# Patient Record
Sex: Male | Born: 1965
Health system: Southern US, Community
[De-identification: ages and names within clinical notes are randomized; demographics above are authoritative.]

## PROBLEM LIST (undated history)

## (undated) DIAGNOSIS — N400 Enlarged prostate without lower urinary tract symptoms: Secondary | ICD-10-CM

## (undated) HISTORY — DX: Benign prostatic hyperplasia without lower urinary tract symptoms: N40.0

## (undated) HISTORY — PX: PROSTATE BIOPSY: SHX241

---

## 2009-02-01 ENCOUNTER — Encounter: Admission: RE | Admit: 2009-02-01 | Discharge: 2009-02-01 | Payer: Self-pay | Admitting: Internal Medicine

## 2015-12-30 DIAGNOSIS — H40009 Preglaucoma, unspecified, unspecified eye: Secondary | ICD-10-CM | POA: Insufficient documentation

## 2015-12-30 DIAGNOSIS — H52213 Irregular astigmatism, bilateral: Secondary | ICD-10-CM | POA: Insufficient documentation

## 2015-12-30 DIAGNOSIS — H2513 Age-related nuclear cataract, bilateral: Secondary | ICD-10-CM | POA: Insufficient documentation

## 2016-03-09 ENCOUNTER — Other Ambulatory Visit: Payer: Self-pay | Admitting: Internal Medicine

## 2016-03-09 DIAGNOSIS — G4489 Other headache syndrome: Secondary | ICD-10-CM

## 2016-07-21 DIAGNOSIS — Z9841 Cataract extraction status, right eye: Secondary | ICD-10-CM | POA: Insufficient documentation

## 2017-05-07 DIAGNOSIS — R1032 Left lower quadrant pain: Secondary | ICD-10-CM | POA: Diagnosis not present

## 2017-05-07 DIAGNOSIS — Z6826 Body mass index (BMI) 26.0-26.9, adult: Secondary | ICD-10-CM | POA: Diagnosis not present

## 2017-05-13 DIAGNOSIS — R1032 Left lower quadrant pain: Secondary | ICD-10-CM | POA: Diagnosis not present

## 2017-05-18 DIAGNOSIS — R1032 Left lower quadrant pain: Secondary | ICD-10-CM | POA: Diagnosis not present

## 2017-05-18 DIAGNOSIS — R1030 Lower abdominal pain, unspecified: Secondary | ICD-10-CM | POA: Diagnosis not present

## 2017-06-25 DIAGNOSIS — Z6825 Body mass index (BMI) 25.0-25.9, adult: Secondary | ICD-10-CM | POA: Diagnosis not present

## 2017-06-25 DIAGNOSIS — Z0000 Encounter for general adult medical examination without abnormal findings: Secondary | ICD-10-CM | POA: Diagnosis not present

## 2017-06-25 DIAGNOSIS — R945 Abnormal results of liver function studies: Secondary | ICD-10-CM | POA: Diagnosis not present

## 2017-08-03 DIAGNOSIS — R972 Elevated prostate specific antigen [PSA]: Secondary | ICD-10-CM | POA: Diagnosis not present

## 2017-10-26 DIAGNOSIS — Z87438 Personal history of other diseases of male genital organs: Secondary | ICD-10-CM | POA: Diagnosis not present

## 2018-05-25 DIAGNOSIS — Z1339 Encounter for screening examination for other mental health and behavioral disorders: Secondary | ICD-10-CM | POA: Diagnosis not present

## 2018-05-25 DIAGNOSIS — Z6824 Body mass index (BMI) 24.0-24.9, adult: Secondary | ICD-10-CM | POA: Diagnosis not present

## 2018-05-25 DIAGNOSIS — E663 Overweight: Secondary | ICD-10-CM | POA: Diagnosis not present

## 2018-05-25 DIAGNOSIS — N400 Enlarged prostate without lower urinary tract symptoms: Secondary | ICD-10-CM | POA: Diagnosis not present

## 2018-05-25 DIAGNOSIS — Z0000 Encounter for general adult medical examination without abnormal findings: Secondary | ICD-10-CM | POA: Diagnosis not present

## 2018-05-25 DIAGNOSIS — Z1331 Encounter for screening for depression: Secondary | ICD-10-CM | POA: Diagnosis not present

## 2018-05-25 DIAGNOSIS — R5382 Chronic fatigue, unspecified: Secondary | ICD-10-CM | POA: Diagnosis not present

## 2018-05-29 ENCOUNTER — Other Ambulatory Visit (HOSPITAL_BASED_OUTPATIENT_CLINIC_OR_DEPARTMENT_OTHER): Payer: Self-pay | Admitting: Internal Medicine

## 2018-05-29 DIAGNOSIS — R7989 Other specified abnormal findings of blood chemistry: Secondary | ICD-10-CM

## 2018-05-29 DIAGNOSIS — R945 Abnormal results of liver function studies: Secondary | ICD-10-CM

## 2018-06-02 ENCOUNTER — Ambulatory Visit (HOSPITAL_BASED_OUTPATIENT_CLINIC_OR_DEPARTMENT_OTHER): Payer: Self-pay

## 2018-06-07 ENCOUNTER — Ambulatory Visit (HOSPITAL_BASED_OUTPATIENT_CLINIC_OR_DEPARTMENT_OTHER)
Admission: RE | Admit: 2018-06-07 | Discharge: 2018-06-07 | Disposition: A | Discharge status: Home / Self Care | Payer: 59 | Source: Ambulatory Visit | Attending: Internal Medicine | Admitting: Internal Medicine

## 2018-06-07 DIAGNOSIS — K824 Cholesterolosis of gallbladder: Secondary | ICD-10-CM | POA: Diagnosis not present

## 2018-06-07 DIAGNOSIS — R109 Unspecified abdominal pain: Secondary | ICD-10-CM | POA: Diagnosis not present

## 2018-06-07 DIAGNOSIS — D1803 Hemangioma of intra-abdominal structures: Secondary | ICD-10-CM | POA: Diagnosis not present

## 2018-06-07 DIAGNOSIS — R7989 Other specified abnormal findings of blood chemistry: Secondary | ICD-10-CM

## 2018-06-07 DIAGNOSIS — R945 Abnormal results of liver function studies: Secondary | ICD-10-CM

## 2018-06-07 DIAGNOSIS — D1809 Hemangioma of other sites: Secondary | ICD-10-CM | POA: Diagnosis not present

## 2018-07-19 DIAGNOSIS — N3020 Other chronic cystitis without hematuria: Secondary | ICD-10-CM | POA: Diagnosis not present

## 2018-07-19 DIAGNOSIS — R972 Elevated prostate specific antigen [PSA]: Secondary | ICD-10-CM | POA: Diagnosis not present

## 2018-07-26 DIAGNOSIS — R972 Elevated prostate specific antigen [PSA]: Secondary | ICD-10-CM | POA: Diagnosis not present

## 2018-07-31 MED FILL — acetaZOLAMIDE 125 MG TABS: 125 | 10 days supply | Qty: 20 | Fill #0

## 2018-08-08 MED FILL — acetaZOLAMIDE 125 MG TABS: 125 | 20 days supply | Qty: 40 | Fill #0

## 2019-04-25 ENCOUNTER — Other Ambulatory Visit: Payer: Self-pay

## 2019-04-25 DIAGNOSIS — K219 Gastro-esophageal reflux disease without esophagitis: Secondary | ICD-10-CM

## 2019-04-25 DIAGNOSIS — K50919 Crohn's disease, unspecified, with unspecified complications: Secondary | ICD-10-CM

## 2019-04-25 DIAGNOSIS — R197 Diarrhea, unspecified: Secondary | ICD-10-CM

## 2019-04-25 DIAGNOSIS — D509 Iron deficiency anemia, unspecified: Secondary | ICD-10-CM

## 2019-04-25 MED ORDER — PEG 3350-KCL-NABCB-NACL-NASULF 236 G PO SOLR: 4000.0000 mL | Freq: Once | ORAL | 0 refills | Status: AC

## 2019-04-25 MED FILL — GAVILYTE-G SOLUTION: 236 | 1 days supply | Qty: 4000 | Fill #0

## 2019-04-25 NOTE — Addendum Note (Signed)
Addended by: Mohammed Kindle on: 04/25/2019 02:21 PM   Modules accepted: Orders

## 2019-05-18 ENCOUNTER — Encounter: Payer: Self-pay | Admitting: Gastroenterology

## 2019-05-28 ENCOUNTER — Ambulatory Visit (INDEPENDENT_AMBULATORY_CARE_PROVIDER_SITE_OTHER): Payer: 59 | Admitting: Family Medicine

## 2019-05-28 ENCOUNTER — Other Ambulatory Visit: Payer: Self-pay

## 2019-05-28 ENCOUNTER — Encounter: Payer: Self-pay | Admitting: Family Medicine

## 2019-05-28 VITALS — BP 104/72 | HR 79 | Temp 97.1°F | Ht 69.0 in | Wt 164.4 lb

## 2019-05-28 DIAGNOSIS — Z125 Encounter for screening for malignant neoplasm of prostate: Secondary | ICD-10-CM

## 2019-05-28 DIAGNOSIS — Z961 Presence of intraocular lens: Secondary | ICD-10-CM | POA: Diagnosis not present

## 2019-05-28 DIAGNOSIS — Z114 Encounter for screening for human immunodeficiency virus [HIV]: Secondary | ICD-10-CM | POA: Diagnosis not present

## 2019-05-28 DIAGNOSIS — Z136 Encounter for screening for cardiovascular disorders: Secondary | ICD-10-CM | POA: Diagnosis not present

## 2019-05-28 DIAGNOSIS — Z23 Encounter for immunization: Secondary | ICD-10-CM | POA: Diagnosis not present

## 2019-05-28 DIAGNOSIS — H2512 Age-related nuclear cataract, left eye: Secondary | ICD-10-CM | POA: Diagnosis not present

## 2019-05-28 DIAGNOSIS — H5231 Anisometropia: Secondary | ICD-10-CM | POA: Diagnosis not present

## 2019-05-28 DIAGNOSIS — Z9841 Cataract extraction status, right eye: Secondary | ICD-10-CM | POA: Diagnosis not present

## 2019-05-28 DIAGNOSIS — Z0000 Encounter for general adult medical examination without abnormal findings: Secondary | ICD-10-CM | POA: Diagnosis not present

## 2019-05-28 DIAGNOSIS — H5213 Myopia, bilateral: Secondary | ICD-10-CM | POA: Diagnosis not present

## 2019-05-28 DIAGNOSIS — H52213 Irregular astigmatism, bilateral: Secondary | ICD-10-CM | POA: Diagnosis not present

## 2019-05-28 DIAGNOSIS — Z9189 Other specified personal risk factors, not elsewhere classified: Secondary | ICD-10-CM

## 2019-05-28 DIAGNOSIS — Z83511 Family history of glaucoma: Secondary | ICD-10-CM | POA: Diagnosis not present

## 2019-05-28 DIAGNOSIS — H40003 Preglaucoma, unspecified, bilateral: Secondary | ICD-10-CM | POA: Diagnosis not present

## 2019-05-28 DIAGNOSIS — H269 Unspecified cataract: Secondary | ICD-10-CM | POA: Diagnosis not present

## 2019-05-28 NOTE — Patient Instructions (Signed)
Give us 2-3 business days to get the results of your labs back.   Keep the diet clean and stay active.  Let us know if you need anything. 

## 2019-05-28 NOTE — Progress Notes (Signed)
Chief Complaint  Patient presents with  . Annual Exam    Well Male Levi Martin is here for a complete physical.   His last physical was >1 year ago.  Current diet: in general, a "healthy" diet.  Current exercise: walking Weight trend: stable Daytime fatigue? No. Seat belt? Yes.    Health maintenance Shingrix- No Colonoscopy- Yes; has this Friday Tetanus- No HIV- No   Past Medical History:  Diagnosis Date  . BPH (benign prostatic hyperplasia)       Past Surgical History:  Procedure Laterality Date  . PROSTATE BIOPSY     x2    Medications  Current Outpatient Medications on File Prior to Visit  Medication Sig Dispense Refill  . tamsulosin (FLOMAX) 0.4 MG CAPS capsule Take twice weekly     Allergies No Known Allergies  Family History Family History  Problem Relation Age of Onset  . Cancer Neg Hx     Review of Systems: Constitutional:  no fevers Eye:  no recent significant change in vision Ear/Nose/Mouth/Throat:  Ears:  no hearing loss Nose/Mouth/Throat:  no complaints of nasal congestion, no sore throat Cardiovascular:  no chest pain, no palpitations Respiratory:  no cough and no shortness of breath Gastrointestinal:  no abdominal pain, no change in bowel habits GU:  Male: negative for dysuria, frequency, and incontinence and negative for prostate symptoms Musculoskeletal/Extremities:  no pain, redness, or swelling of the joints Integumentary (Skin/Breast):  no abnormal skin lesions reported Neurologic:  no headaches Endocrine: No unexpected weight changes Hematologic/Lymphatic:  no abnormal bleeding  Exam BP 104/72 (BP Location: Left Arm, Patient Position: Sitting, Cuff Size: Normal)   Pulse 79   Temp (!) 97.1 F (36.2 C) (Temporal)   Ht 5\' 9"  (1.753 m)   Wt 164 lb 6 oz (74.6 kg)   SpO2 97%   BMI 24.27 kg/m  General:  well developed, well nourished, in no apparent distress Skin:  no significant moles, warts, or growths Head:  no masses, lesions,  or tenderness Eyes:  pupils equal and round, sclera anicteric without injection Ears:  canals without lesions, TMs shiny without retraction, no obvious effusion, no erythema Nose:  nares patent, septum midline, mucosa normal Throat/Pharynx:  lips and gingiva without lesion; tongue and uvula midline; non-inflamed pharynx; no exudates or postnasal drainage Neck: neck supple without adenopathy, thyromegaly, or masses Lungs:  clear to auscultation, breath sounds equal bilaterally, no respiratory distress Cardio:  regular rate and rhythm, no LE edema, no bruits Abdomen:  abdomen soft, nontender; bowel sounds normal; no masses or organomegaly Rectal: Deferred Musculoskeletal:  symmetrical muscle groups noted without atrophy or deformity Extremities:  no clubbing, cyanosis, or edema, no deformities, no skin discoloration Neuro:  gait normal; deep tendon reflexes normal and symmetric Psych: well oriented with normal range of affect and appropriate judgment/insight  Assessment and Plan  Well adult exam - Plan: CBC, Comprehensive metabolic panel, Lipid panel  Encounter for screening for coronary artery disease - Plan: CT CARDIAC SCORING  Screening for HIV (human immunodeficiency virus) - Plan: HIV Antibody (routine testing w rflx)  Screening PSA (prostate specific antigen) - Plan: PSA   Well 53 y.o. male. Counseled on diet and exercise. Counseled on risks and benefits of prostate cancer screening with PSA. The patient agrees to undergo testing. +Hx of elevated PSA w neg biopsy x2.  Discussed CACS w CT chest. He would like to proceed. Immunizations, labs, and further orders as above. Follow up in 1 yr or prn. The patient voiced  understanding and agreement to the plan.  [place], DO 05/28/19 2:26 PM

## 2019-05-28 NOTE — Addendum Note (Signed)
Addended by: Sharon Seller B on: 05/28/2019 02:35 PM   Modules accepted: Orders

## 2019-06-01 ENCOUNTER — Ambulatory Visit (AMBULATORY_SURGERY_CENTER): Payer: 59 | Admitting: Gastroenterology

## 2019-06-01 ENCOUNTER — Encounter: Payer: Self-pay | Admitting: Gastroenterology

## 2019-06-01 ENCOUNTER — Other Ambulatory Visit: Payer: Self-pay

## 2019-06-01 VITALS — BP 107/71 | HR 68 | Temp 98.6°F | Resp 24 | Wt 164.0 lb

## 2019-06-01 DIAGNOSIS — Z1211 Encounter for screening for malignant neoplasm of colon: Secondary | ICD-10-CM | POA: Diagnosis not present

## 2019-06-01 DIAGNOSIS — D120 Benign neoplasm of cecum: Secondary | ICD-10-CM

## 2019-06-01 DIAGNOSIS — D123 Benign neoplasm of transverse colon: Secondary | ICD-10-CM | POA: Diagnosis not present

## 2019-06-01 MED ORDER — SODIUM CHLORIDE 0.9 % IV SOLN: 500.0000 mL | Freq: Once | INTRAVENOUS | Status: DC

## 2019-06-01 NOTE — Progress Notes (Signed)
Pt's states no medical or surgical changes since previsit or office visit. 

## 2019-06-01 NOTE — Op Note (Signed)
[facility] Patient Name: Stephon Dernbach Procedure Date: 06/01/2019 7:23 AM MRN: [id number] Endoscopist: Milus Banister , MD Age: 53 Referring MD:  Date of Birth: 1965-12-13 Gender: Male Account #: [id number] Procedure:                Colonoscopy Indications:              High risk colon cancer surveillance: Personal                            history of colonic polyps; Colonoscopy 2015 Dr.                            Delrae Alfred found two subCM adenomas Medicines:                Monitored Anesthesia Care Procedure:                Pre-Anesthesia Assessment:                           - Prior to the procedure, a History and Physical                            was performed, and patient medications and                            allergies were reviewed. The patient's tolerance of                            previous anesthesia was also reviewed. The risks                            and benefits of the procedure and the sedation                            options and risks were discussed with the patient.                            All questions were answered, and informed consent                            was obtained. Prior Anticoagulants: The patient has                            taken no previous anticoagulant or antiplatelet                            agents. ASA Grade Assessment: II - A patient with                            mild systemic disease. After reviewing the risks                            and benefits, the patient was deemed in  satisfactory condition to undergo the procedure.                           After obtaining informed consent, the colonoscope                            was passed under direct vision. Throughout the                            procedure, the patient's blood pressure, pulse, and                            oxygen saturations were monitored continuously. The                            Colonoscope was introduced through  the anus and                            advanced to the the cecum, identified by                            appendiceal orifice and ileocecal valve. The                            colonoscopy was performed without difficulty. The                            patient tolerated the procedure well. The quality                            of the bowel preparation was excellent. The                            ileocecal valve, appendiceal orifice, and rectum                            were photographed. Scope In: 7:33:43 AM Scope Out: 7:54:47 AM Scope Withdrawal Time: 0 hours 16 minutes 57 seconds  Total Procedure Duration: 0 hours 21 minutes 4 seconds  Findings:                 A 1 mm polyp was found in the cecum. The polyp was                            sessile. The polyp was removed with a cold biopsy                            forceps. Resection and retrieval were complete.                           A 3 mm polyp was found in the transverse colon. The                            polyp was sessile. The polyp was  removed with a                            cold snare. Resection and retrieval were complete.                           The exam was otherwise without abnormality on                            direct and retroflexion views. Complications:            No immediate complications. Estimated blood loss:                            None. Estimated Blood Loss:     Estimated blood loss: none. Impression:               - One 1 mm polyp in the cecum, removed with a cold                            biopsy forceps. Resected and retrieved.                           - One 3 mm polyp in the transverse colon, removed                            with a cold snare. Resected and retrieved.                           - The examination was otherwise normal on direct                            and retroflexion views. Recommendation:           - Patient has a contact number available for                             emergencies. The signs and symptoms of potential                            delayed complications were discussed with the                            patient. Return to normal activities tomorrow.                            Written discharge instructions were provided to the                            patient.                           - Resume previous diet.                           - Continue present medications.                           -  Await pathology results. Likely repeat                            colonoscopy in 7-10 years. Milus Banister, MD 06/01/2019 7:58:37 AM This report has been signed electronically.

## 2019-06-01 NOTE — Progress Notes (Signed)
Temperature taken by J.B., VS taken by C.W. 

## 2019-06-01 NOTE — Progress Notes (Signed)
Report given to PACU, vss 

## 2019-06-01 NOTE — Progress Notes (Signed)
Called to room to assist during endoscopic procedure.  Patient ID and intended procedure confirmed with present staff. Received instructions for my participation in the procedure from the performing physician.  

## 2019-06-01 NOTE — Patient Instructions (Signed)
Handout on polyps given   YOU HAD AN ENDOSCOPIC PROCEDURE TODAY AT THE [facility]:   Refer to the procedure report that was given to you for any specific questions about what was found during the examination.  If the procedure report does not answer your questions, please call your gastroenterologist to clarify.  If you requested that your care partner not be given the details of your procedure findings, then the procedure report has been included in a sealed envelope for you to review at your convenience later.  YOU SHOULD EXPECT: Some feelings of bloating in the abdomen. Passage of more gas than usual.  Walking can help get rid of the air that was put into your GI tract during the procedure and reduce the bloating. If you had a lower endoscopy (such as a colonoscopy or flexible sigmoidoscopy) you may notice spotting of blood in your stool or on the toilet paper. If you underwent a bowel prep for your procedure, you may not have a normal bowel movement for a few days.  Please Note:  You might notice some irritation and congestion in your nose or some drainage.  This is from the oxygen used during your procedure.  There is no need for concern and it should clear up in a day or so.  SYMPTOMS TO REPORT IMMEDIATELY:   Following lower endoscopy (colonoscopy or flexible sigmoidoscopy):  Excessive amounts of blood in the stool  Significant tenderness or worsening of abdominal pains  Swelling of the abdomen that is new, acute  Fever of 100F or higher   For urgent or emergent issues, a gastroenterologist can be reached at any hour by calling [phone].   DIET:  We do recommend a small meal at first, but then you may proceed to your regular diet.  Drink plenty of fluids but you should avoid alcoholic beverages for 24 hours.  ACTIVITY:  You should plan to take it easy for the rest of today and you should NOT DRIVE or use heavy machinery until tomorrow (because of the sedation  medicines used during the test).    FOLLOW UP: Our staff will call the number listed on your records 48-72 hours following your procedure to check on you and address any questions or concerns that you may have regarding the information given to you following your procedure. If we do not reach you, we will leave a message.  We will attempt to reach you two times.  During this call, we will ask if you have developed any symptoms of COVID 19. If you develop any symptoms (ie: fever, flu-like symptoms, shortness of breath, cough etc.) before then, please call [phone].  If you test positive for Covid 19 in the 2 weeks post procedure, please call and report this information to us.    If any biopsies were taken you will be contacted by phone or by letter within the next 1-3 weeks.  Please call us at [phone] if you have not heard about the biopsies in 3 weeks.    SIGNATURES/CONFIDENTIALITY: You and/or your care partner have signed paperwork which will be entered into your electronic medical record.  These signatures attest to the fact that that the information above on your After Visit Summary has been reviewed and is understood.  Full responsibility of the confidentiality of this discharge information lies with you and/or your care-partner. 

## 2019-06-05 ENCOUNTER — Telehealth: Payer: Self-pay

## 2019-06-05 ENCOUNTER — Encounter: Payer: Self-pay | Admitting: Gastroenterology

## 2019-06-05 NOTE — Telephone Encounter (Signed)
  Follow up Call-  Call back number 06/01/2019  Post procedure Call Back phone  # [phone]  Permission to leave phone message Yes  Some recent data might be hidden     Patient questions:  Do you have a fever, pain , or abdominal swelling? No. Pain Score  0 *  Have you tolerated food without any problems? Yes.    Have you been able to return to your normal activities? Yes.    Do you have any questions about your discharge instructions: Diet   No. Medications  No. Follow up visit  No.  Do you have questions or concerns about your Care? No.  Actions: * If pain score is 4 or above: No action needed, pain <4.  1. Have you developed a fever since your procedure? no  2.   Have you had an respiratory symptoms (SOB or cough) since your procedure? no  3.   Have you tested positive for COVID 19 since your procedure no  4.   Have you had any family members/close contacts diagnosed with the COVID 19 since your procedure?  no   If yes to any of these questions please route to Joylene John, RN and Alphonsa Gin, Therapist, sports.

## 2019-06-26 DIAGNOSIS — U071 COVID-19: Secondary | ICD-10-CM | POA: Diagnosis not present

## 2019-06-28 ENCOUNTER — Ambulatory Visit
Admission: RE | Admit: 2019-06-28 | Discharge: 2019-06-28 | Disposition: A | Discharge status: Home / Self Care | Payer: No Typology Code available for payment source | Source: Ambulatory Visit | Attending: Family Medicine | Admitting: Family Medicine

## 2019-06-28 DIAGNOSIS — Z136 Encounter for screening for cardiovascular disorders: Secondary | ICD-10-CM

## 2019-07-06 ENCOUNTER — Other Ambulatory Visit (INDEPENDENT_AMBULATORY_CARE_PROVIDER_SITE_OTHER): Payer: 59

## 2019-07-06 ENCOUNTER — Other Ambulatory Visit: Payer: Self-pay

## 2019-07-06 DIAGNOSIS — Z125 Encounter for screening for malignant neoplasm of prostate: Secondary | ICD-10-CM | POA: Diagnosis not present

## 2019-07-06 DIAGNOSIS — Z114 Encounter for screening for human immunodeficiency virus [HIV]: Secondary | ICD-10-CM

## 2019-07-06 DIAGNOSIS — Z0000 Encounter for general adult medical examination without abnormal findings: Secondary | ICD-10-CM

## 2019-07-06 LAB — CBC
HCT: 46.4 % (ref 39.0–52.0)
Hemoglobin: 15.5 g/dL (ref 13.0–17.0)
MCHC: 33.4 g/dL (ref 30.0–36.0)
MCV: 90.2 fl (ref 78.0–100.0)
Platelets: 293 10*3/uL (ref 150.0–400.0)
RBC: 5.14 Mil/uL (ref 4.22–5.81)
RDW: 12.4 % (ref 11.5–15.5)
WBC: 6.5 10*3/uL (ref 4.0–10.5)

## 2019-07-06 LAB — COMPREHENSIVE METABOLIC PANEL
ALT: 30 U/L (ref 0–53)
AST: 23 U/L (ref 0–37)
Albumin: 4.5 g/dL (ref 3.5–5.2)
Alkaline Phosphatase: 67 U/L (ref 39–117)
BUN: 14 mg/dL (ref 6–23)
CO2: 28 mEq/L (ref 19–32)
Calcium: 9.4 mg/dL (ref 8.4–10.5)
Chloride: 104 mEq/L (ref 96–112)
Creatinine, Ser: 1.06 mg/dL (ref 0.40–1.50)
GFR: 73.6 mL/min (ref >60.00)
Glucose, Bld: 99 mg/dL (ref 70–99)
Potassium: 4.4 mEq/L (ref 3.5–5.1)
Sodium: 139 mEq/L (ref 135–145)
Total Bilirubin: 1.2 mg/dL (ref 0.2–1.2)
Total Protein: 6.8 g/dL (ref 6.0–8.3)

## 2019-07-06 LAB — LIPID PANEL
Cholesterol: 152 mg/dL (ref 0–200)
HDL: 44.2 mg/dL (ref >39.00)
LDL Cholesterol: 90 mg/dL (ref 0–99)
NonHDL: 107.9
Total CHOL/HDL Ratio: 3
Triglycerides: 91 mg/dL (ref 0.0–149.0)
VLDL: 18.2 mg/dL (ref 0.0–40.0)

## 2019-07-06 LAB — PSA: PSA: 3.76 ng/mL (ref 0.10–4.00)

## 2019-07-06 NOTE — Addendum Note (Signed)
Addended by: Kelle Darting A on: 07/06/2019 08:02 AM   Modules accepted: Orders

## 2019-07-07 LAB — HIV ANTIBODY (ROUTINE TESTING W REFLEX): HIV 1&2 Ab, 4th Generation: NONREACTIVE

## 2019-09-07 ENCOUNTER — Encounter: Payer: Self-pay | Admitting: Family Medicine

## 2019-09-07 MED ORDER — DUTASTERIDE-TAMSULOSIN HCL 0.5-0.4 MG PO CAPS: 1.0000 | ORAL_CAPSULE | Freq: Every day | ORAL | 11 refills | Status: DC

## 2019-09-13 ENCOUNTER — Encounter: Payer: Self-pay | Admitting: *Deleted

## 2019-09-13 ENCOUNTER — Telehealth: Payer: Self-pay | Admitting: *Deleted

## 2019-09-13 NOTE — Telephone Encounter (Signed)
PA initiated via Covermymeds; KEY: [id number]. Awaiting determination.

## 2019-09-13 NOTE — Telephone Encounter (Signed)
Insurance will not cover dutasteride-tamsulosin 0.5/0.4mg  Sharyne Richters).  They require a trial of finasteride 5mg , alfuzosin, doxazosin, prazosin, sildosin, tamsulosin, or terazosin.  Patient has tried tamsulosin in the past.  Can you try to do a PA on this please?

## 2019-09-17 MED FILL — DUTASTERIDE-TAMSULOSIN 0.5-: 0.5-0.4 | 30 days supply | Qty: 30 | Fill #0

## 2019-09-17 NOTE — Telephone Encounter (Signed)
PA approved.  The request has been approved. The authorization is effective for a maximum of 12 fills from 09/14/2019 to 09/12/2020, as long as the member is enrolled in their current health plan. The request was approved as submitted. A written notification letter will follow with additional details.

## 2019-10-30 MED FILL — DUTASTERIDE-TAMSULOSIN 0.5-: 0.5-0.4 | 30 days supply | Qty: 30 | Fill #1

## 2019-11-13 MED FILL — DUTASTERIDE-TAMSULOSIN 0.5-: 0.5-0.4 | 30 days supply | Qty: 30 | Fill #1

## 2019-12-26 DIAGNOSIS — Z20828 Contact with and (suspected) exposure to other viral communicable diseases: Secondary | ICD-10-CM | POA: Diagnosis not present

## 2019-12-26 DIAGNOSIS — Z03818 Encounter for observation for suspected exposure to other biological agents ruled out: Secondary | ICD-10-CM | POA: Diagnosis not present

## 2019-12-28 DIAGNOSIS — Z20828 Contact with and (suspected) exposure to other viral communicable diseases: Secondary | ICD-10-CM | POA: Diagnosis not present

## 2019-12-28 DIAGNOSIS — Z03818 Encounter for observation for suspected exposure to other biological agents ruled out: Secondary | ICD-10-CM | POA: Diagnosis not present

## 2020-02-12 MED FILL — DUTASTERIDE-TAMSULOSIN 0.5-: 0.5-0.4 | 30 days supply | Qty: 30 | Fill #3

## 2020-03-18 IMAGING — CT CT HEART SCORING
3 series · 13 of 20 positions shown, 15 images · non-contrast
Comparison: None.

CLINICAL DATA: Encounter for screening for coronary artery disease.

EXAM:
CT HEART FOR CALCIUM SCORING
TECHNIQUE: CT heart was performed using prospective ECG gating.
A non-contrast exam for calcium scoring was performed.
Note that this exam targets the heart and the chest was not imaged
in its entirety.

[Series 2: calcium scoring 2.00 qr36 bestdiast 70% · axial · 0.35mm/px · z∈[+2043,+2105]mm · 3 of 78 slices shown]
[im 16/78  vessel]
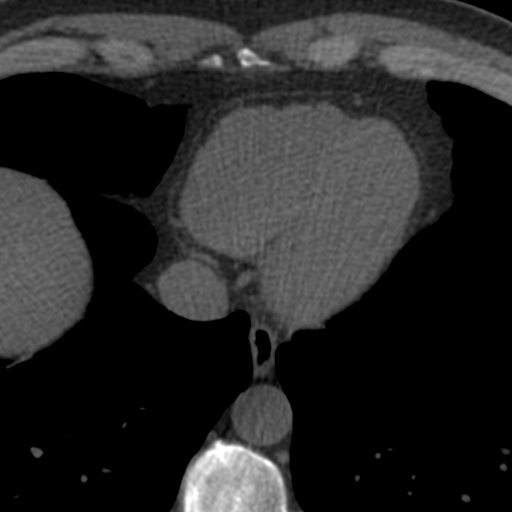
[im 31/78  vessel]
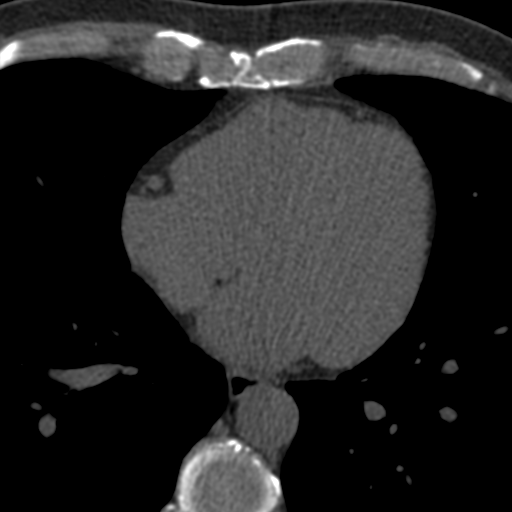
[im 47/78  vessel]
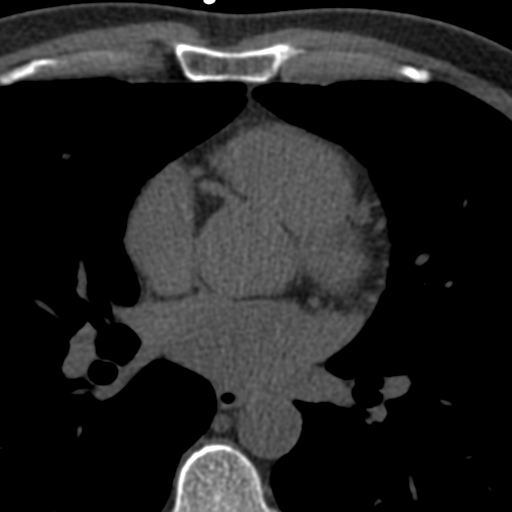

[Series 3: calcium scoring 2.00 br40 bestdiast 70% fov · axial · 0.55mm/px · z∈[+2037,+2141]mm · 5 of 78 slices shown, 7 images]
[im 13/78  vessel]
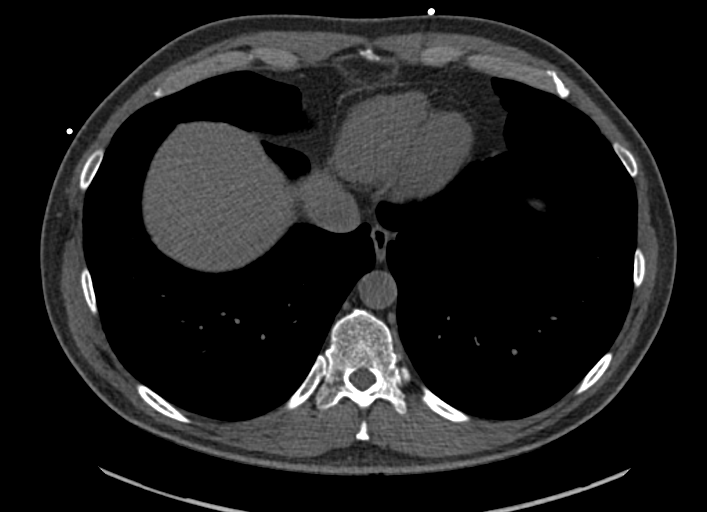
[im 13/78  lung]
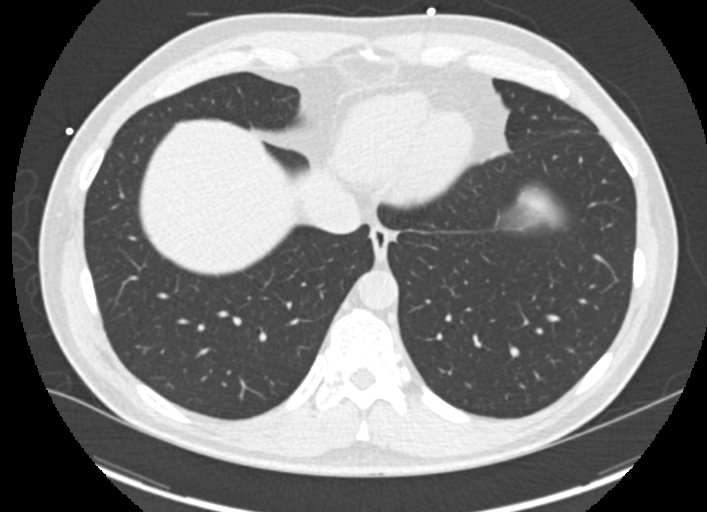
[im 26/78  vessel]
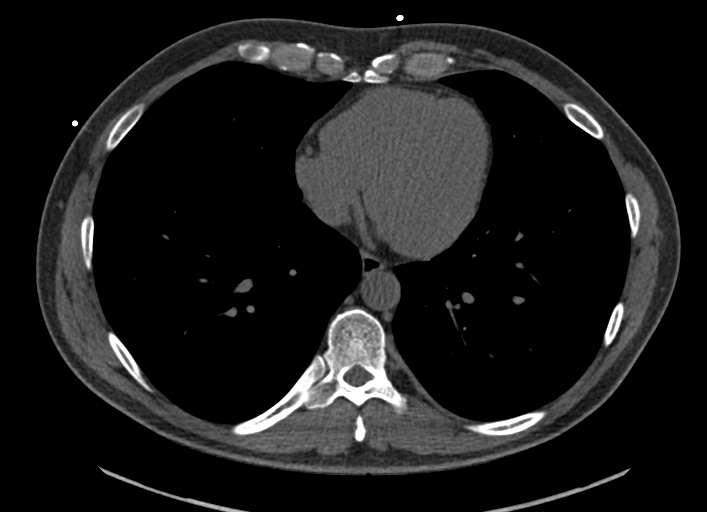
[im 39/78  vessel]
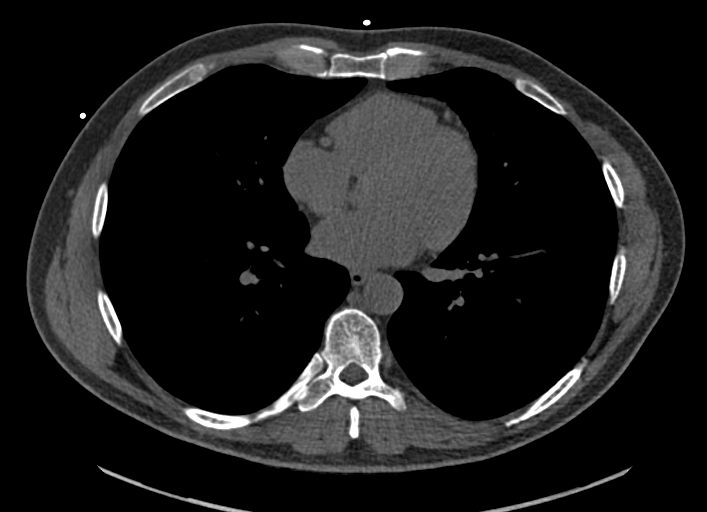
[im 52/78  vessel]
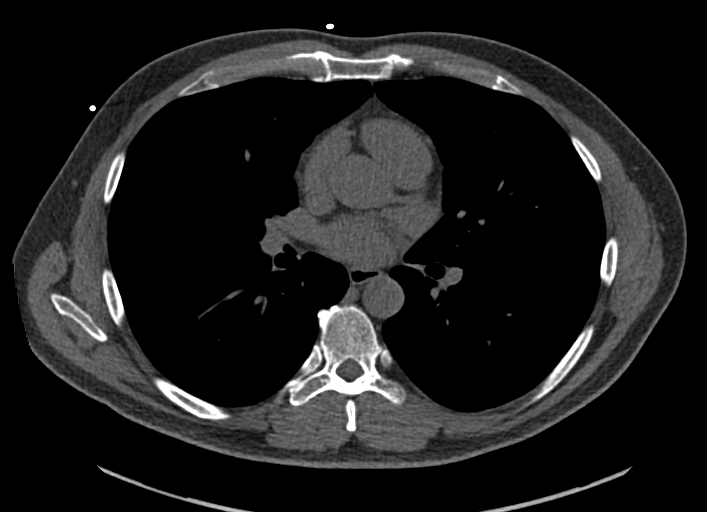
[im 65/78  vessel]
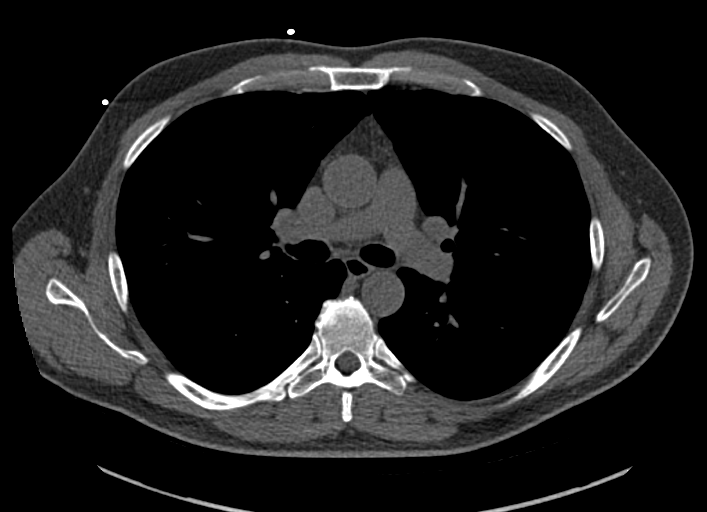
[im 65/78  lung]
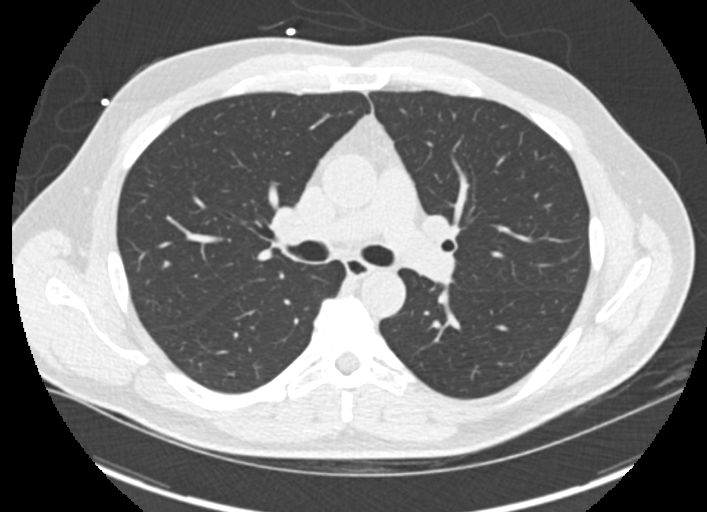

[Series 9: calcium scoring 2.00 br60 bestdiast 70% fov · axial · 0.55mm/px · z∈[+2037,+2141]mm · 5 of 78 slices shown]
[im 13/78  vessel]
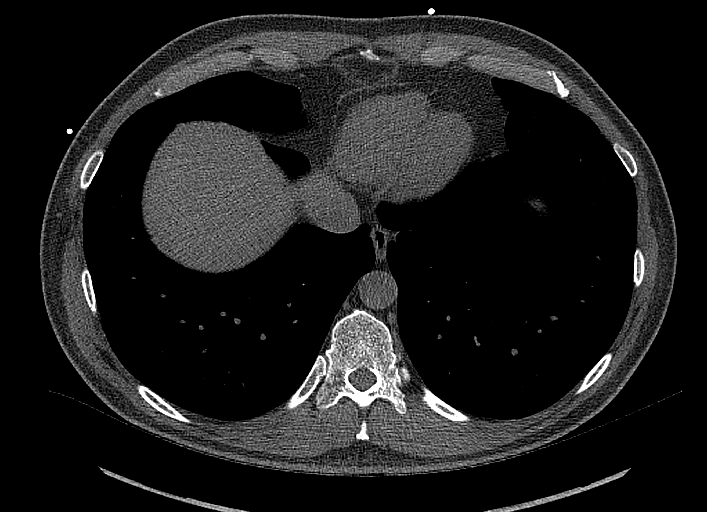
[im 26/78  vessel]
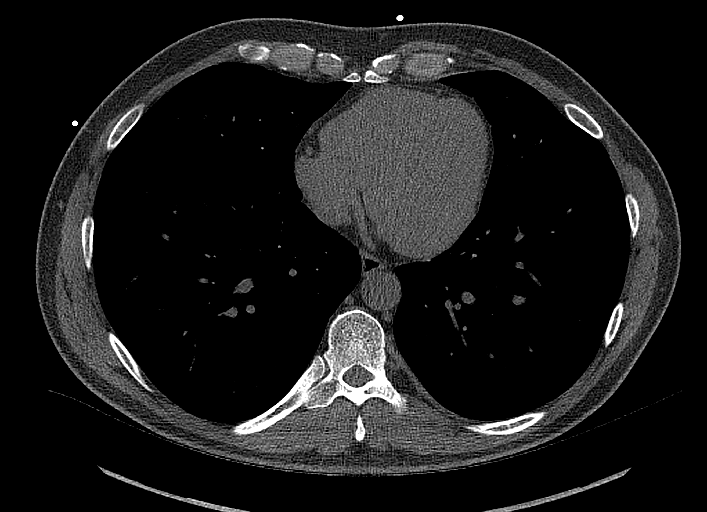
[im 39/78  vessel]
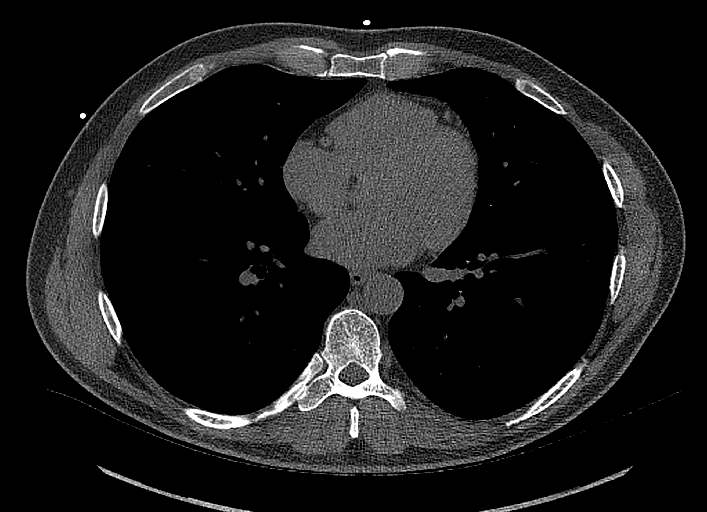
[im 52/78  vessel]
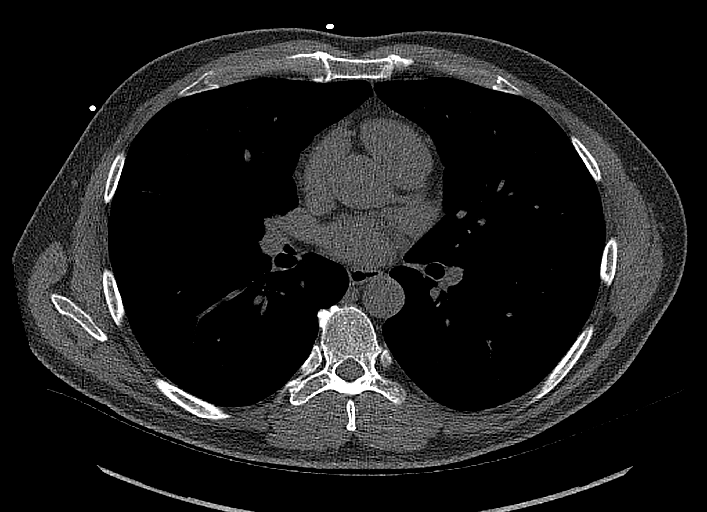
[im 65/78  vessel]
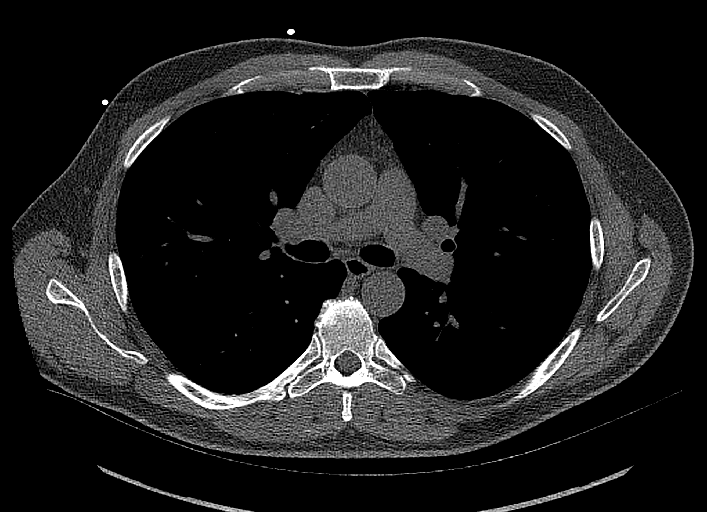

[13 of 20 positions shown; findings below may reference images not displayed]

FINDINGS: Technical quality: Good.

CORONARY CALCIUM

Total Agatston Score:

[HOSPITAL] percentile:  72

OTHER FINDINGS:

Cardiovascular: Small amount of coronary calcium in the right
coronary artery and near the origin of the LAD. Largest calcium
burden is at the LAD. Heart size is normal. Trace calcium in the
thoracic aorta. Normal caliber of the ascending thoracic aorta
measuring roughly 3.0 cm. Heart size is normal without significant
pericardial fluid.

Mediastinum/Nodes: Mediastinal structures are within normal limits.
Small amount of gas within the visualized esophagus.

Lungs/Pleura: No large pleural effusions. Small focus of thickening
along the right major fissure on sequence 9, image 33. No
significant airspace disease or consolidation in the visualized
lungs.

Upper Abdomen: Images of the upper abdomen are unremarkable.

Musculoskeletal: Negative.
IMPRESSION: Coronary calcium score is 21.1 and this is at percentile 72 for
subjects of the same age, gender and ethnicity (white).

## 2020-03-19 DIAGNOSIS — H5213 Myopia, bilateral: Secondary | ICD-10-CM | POA: Diagnosis not present

## 2020-03-19 DIAGNOSIS — H2512 Age-related nuclear cataract, left eye: Secondary | ICD-10-CM | POA: Diagnosis not present

## 2020-03-19 DIAGNOSIS — H5231 Anisometropia: Secondary | ICD-10-CM | POA: Diagnosis not present

## 2020-03-27 MED FILL — DUTASTERIDE-TAMSULOSIN 0.5-: 0.5-0.4 | 30 days supply | Qty: 30 | Fill #4

## 2020-04-03 DIAGNOSIS — R82998 Other abnormal findings in urine: Secondary | ICD-10-CM | POA: Diagnosis not present

## 2020-04-10 DIAGNOSIS — N400 Enlarged prostate without lower urinary tract symptoms: Secondary | ICD-10-CM | POA: Diagnosis not present

## 2020-04-10 DIAGNOSIS — N402 Nodular prostate without lower urinary tract symptoms: Secondary | ICD-10-CM | POA: Insufficient documentation

## 2020-04-10 DIAGNOSIS — R82998 Other abnormal findings in urine: Secondary | ICD-10-CM | POA: Diagnosis not present

## 2020-04-12 DIAGNOSIS — N400 Enlarged prostate without lower urinary tract symptoms: Secondary | ICD-10-CM | POA: Insufficient documentation

## 2020-04-18 ENCOUNTER — Ambulatory Visit: Payer: 59 | Attending: Internal Medicine

## 2020-04-18 DIAGNOSIS — Z23 Encounter for immunization: Secondary | ICD-10-CM

## 2020-04-18 NOTE — Progress Notes (Signed)
   Covid-19 Vaccination Clinic  Name:  Levi Martin    MRN: [id number] DOB: [birth year]  04/18/2020  Mr. [name] was observed post Covid-19 immunization for 15 minutes without incident. He was provided with Vaccine Information Sheet and instruction to access the V-Safe system.   Mr. [name] was instructed to call 911 with any severe reactions post vaccine: Marland Kitchen Difficulty breathing  . Swelling of face and throat  . A fast heartbeat  . A bad rash all over body  . Dizziness and weakness

## 2020-04-29 DIAGNOSIS — Z20822 Contact with and (suspected) exposure to covid-19: Secondary | ICD-10-CM | POA: Diagnosis not present

## 2020-04-29 MED FILL — DUTASTERIDE-TAMSULOSIN 0.5-: 0.5-0.4 | 30 days supply | Qty: 30 | Fill #5

## 2020-05-27 ENCOUNTER — Other Ambulatory Visit (HOSPITAL_BASED_OUTPATIENT_CLINIC_OR_DEPARTMENT_OTHER): Payer: Self-pay | Admitting: Internal Medicine

## 2020-05-27 MED FILL — FLUARIX QUADRIVALENT 0.5 ML: 0.5 | 1 days supply | Qty: 1 | Fill #0

## 2020-06-06 MED FILL — DUTASTERIDE-TAMSULOSIN 0.5-: 0.5-0.4 | 30 days supply | Qty: 30 | Fill #6

## 2020-08-01 MED FILL — DUTASTERIDE-TAMSULOSIN 0.5-: 0.5-0.4 | 30 days supply | Qty: 30 | Fill #7

## 2020-09-26 DIAGNOSIS — H5231 Anisometropia: Secondary | ICD-10-CM | POA: Insufficient documentation

## 2020-09-26 DIAGNOSIS — H2512 Age-related nuclear cataract, left eye: Secondary | ICD-10-CM | POA: Insufficient documentation

## 2020-10-01 ENCOUNTER — Other Ambulatory Visit: Payer: Self-pay | Admitting: Family Medicine

## 2020-10-01 MED FILL — DUTASTERIDE-TAMSULOSIN 0.5-: 0.5-0.4 | 30 days supply | Qty: 30 | Fill #0

## 2020-10-10 MED FILL — DUTASTERIDE-TAMSULOSIN 0.5-: 0.5-0.4 | 30 days supply | Qty: 30 | Fill #0

## 2020-11-10 ENCOUNTER — Other Ambulatory Visit (HOSPITAL_COMMUNITY): Payer: Self-pay | Admitting: *Deleted

## 2020-11-17 ENCOUNTER — Ambulatory Visit (HOSPITAL_BASED_OUTPATIENT_CLINIC_OR_DEPARTMENT_OTHER)
Admission: RE | Admit: 2020-11-17 | Discharge: 2020-11-17 | Disposition: A | Discharge status: Home / Self Care | Payer: 59 | Source: Ambulatory Visit | Attending: Cardiology | Admitting: Cardiology

## 2020-11-17 ENCOUNTER — Other Ambulatory Visit: Payer: Self-pay

## 2020-12-17 ENCOUNTER — Other Ambulatory Visit (HOSPITAL_BASED_OUTPATIENT_CLINIC_OR_DEPARTMENT_OTHER): Payer: Self-pay

## 2020-12-17 MED FILL — Dutasteride-Tamsulosin HCl Cap 0.5-0.4 MG: ORAL | 30 days supply | Qty: 30 | Fill #0 | Status: AC

## 2020-12-24 ENCOUNTER — Other Ambulatory Visit: Payer: Self-pay

## 2020-12-24 ENCOUNTER — Ambulatory Visit: Payer: 59 | Admitting: Podiatry

## 2020-12-24 ENCOUNTER — Ambulatory Visit (INDEPENDENT_AMBULATORY_CARE_PROVIDER_SITE_OTHER): Payer: 59

## 2020-12-24 ENCOUNTER — Other Ambulatory Visit (HOSPITAL_BASED_OUTPATIENT_CLINIC_OR_DEPARTMENT_OTHER): Payer: Self-pay

## 2020-12-24 ENCOUNTER — Encounter: Payer: Self-pay | Admitting: Podiatry

## 2020-12-24 DIAGNOSIS — M722 Plantar fascial fibromatosis: Secondary | ICD-10-CM | POA: Diagnosis not present

## 2020-12-24 MED ORDER — METHYLPREDNISOLONE 4 MG PO TBPK
ORAL_TABLET | ORAL | 0 refills | Status: DC
  Filled 2020-12-24: qty 21, 6d supply, fill #0

## 2020-12-24 MED ORDER — TRIAMCINOLONE ACETONIDE 40 MG/ML IJ SUSP
20.0000 mg | Freq: Once | INTRAMUSCULAR | Status: AC
  Administered 2020-12-24: 20 mg

## 2020-12-24 MED ORDER — MELOXICAM 15 MG PO TABS
15.0000 mg | ORAL_TABLET | Freq: Every day | ORAL | 3 refills | Status: DC
  Filled 2020-12-24: qty 30, 30d supply, fill #0

## 2020-12-24 NOTE — Patient Instructions (Signed)

## 2020-12-28 NOTE — Progress Notes (Signed)
  Subjective:  Patient ID: Levi Martin, male    DOB: 1965-12-19,  MRN: [id number] HPI Chief Complaint  Patient presents with  . Foot Pain    Plantar heel left - aching x 2 weeks, AM pain, some days worse than others  . New Patient (Initial Visit)    55 y.o. male presents with the above complaint.  ROS: Denies fever chills nausea vomit muscle aches pains calf pain back pain chest pain shortness of breath.  Past Medical History:  Diagnosis Date  . BPH (benign prostatic hyperplasia)    Past Surgical History:  Procedure Laterality Date  . PROSTATE BIOPSY     x2    Current Outpatient Medications:  .  meloxicam (MOBIC) 15 MG tablet, Take 1 tablet (15 mg total) by mouth daily., Disp: 30 tablet, Rfl: 3 .  methylPREDNISolone (MEDROL DOSEPAK) 4 MG TBPK tablet, 6 day dose pack - take as directed, Disp: 21 tablet, Rfl: 0 .  Dutasteride-Tamsulosin HCl 0.5-0.4 MG CAPS, TAKE 1 CAPSULE BY MOUTH ONCE DAILY, Disp: 30 capsule, Rfl: 11 .  influenza vac split quadrivalent PF (FLUARIX) 0.5 ML injection, TAKE AS DIRECTED, Disp: .5 mL, Rfl: 0  No Known Allergies Review of Systems Objective:  There were no vitals filed for this visit.  General: Well developed, nourished, in no acute distress, alert and oriented x3   Dermatological: Skin is warm, dry and supple bilateral. Nails x 10 are well maintained; remaining integument appears unremarkable at this time. There are no open sores, no preulcerative lesions, no rash or signs of infection present.  Vascular: Dorsalis Pedis artery and Posterior Tibial artery pedal pulses are 2/4 bilateral with immedate capillary fill time. Pedal hair growth present. No varicosities and no lower extremity edema present bilateral.   Neruologic: Grossly intact via light touch bilateral. Vibratory intact via tuning fork bilateral. Protective threshold with Semmes Wienstein monofilament intact to all pedal sites bilateral. Patellar and Achilles deep tendon reflexes 2+  bilateral. No Babinski or clonus noted bilateral.   Musculoskeletal: No gross boney pedal deformities bilateral. No pain, crepitus, or limitation noted with foot and ankle range of motion bilateral. Muscular strength 5/5 in all groups tested bilateral.  Pain on palpation medial calcaneal tubercle of the left heel.  No pain on medial lateral compression of the calcaneus.  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  Gait: Unassisted, Nonantalgic.    Radiographs:  Radiographs taken today demonstrate soft tissue increase in density plantar fascial cannula insertion site.  Does not demonstrate any type of osseous abnormalities and there are no acute findings are noted.  Assessment & Plan:   Assessment: Plan fasciitis left.  Plan: Discussed etiology pathology conservative surgical therapies at this point injected his left heel today 20 mg Kenalog 5 mg Marcaine for maximal tenderness.  I will start him on a Medrol Dosepak that he was quite reluctant.  Also went ahead and started him on meloxicam to follow the prednisone.  15 mg 1 p.o. daily placed on plantar fascial brace.  Discussed appropriate shoe gear stretching exercise ice therapy sugar modifications.  I will follow-up with him in 1 month.     Levi Martin, [place]

## 2021-01-02 ENCOUNTER — Other Ambulatory Visit (HOSPITAL_BASED_OUTPATIENT_CLINIC_OR_DEPARTMENT_OTHER): Payer: Self-pay

## 2021-01-07 DIAGNOSIS — H52213 Irregular astigmatism, bilateral: Secondary | ICD-10-CM | POA: Diagnosis not present

## 2021-01-07 DIAGNOSIS — H43822 Vitreomacular adhesion, left eye: Secondary | ICD-10-CM | POA: Diagnosis not present

## 2021-01-07 DIAGNOSIS — H40003 Preglaucoma, unspecified, bilateral: Secondary | ICD-10-CM | POA: Diagnosis not present

## 2021-01-07 DIAGNOSIS — H2512 Age-related nuclear cataract, left eye: Secondary | ICD-10-CM | POA: Diagnosis not present

## 2021-01-07 DIAGNOSIS — H5213 Myopia, bilateral: Secondary | ICD-10-CM | POA: Diagnosis not present

## 2021-01-07 DIAGNOSIS — H538 Other visual disturbances: Secondary | ICD-10-CM | POA: Diagnosis not present

## 2021-01-07 DIAGNOSIS — H5231 Anisometropia: Secondary | ICD-10-CM | POA: Diagnosis not present

## 2021-02-04 ENCOUNTER — Other Ambulatory Visit (HOSPITAL_BASED_OUTPATIENT_CLINIC_OR_DEPARTMENT_OTHER): Payer: Self-pay

## 2021-02-04 MED FILL — Dutasteride-Tamsulosin HCl Cap 0.5-0.4 MG: ORAL | 30 days supply | Qty: 30 | Fill #1 | Status: AC

## 2021-04-13 ENCOUNTER — Other Ambulatory Visit (HOSPITAL_BASED_OUTPATIENT_CLINIC_OR_DEPARTMENT_OTHER): Payer: Self-pay

## 2021-04-13 MED FILL — Dutasteride-Tamsulosin HCl Cap 0.5-0.4 MG: ORAL | 30 days supply | Qty: 30 | Fill #2 | Status: CN

## 2021-04-14 ENCOUNTER — Telehealth: Payer: Self-pay | Admitting: Family Medicine

## 2021-04-14 ENCOUNTER — Other Ambulatory Visit (HOSPITAL_BASED_OUTPATIENT_CLINIC_OR_DEPARTMENT_OTHER): Payer: Self-pay

## 2021-04-14 NOTE — Telephone Encounter (Signed)
Initiated PA for Dutasteride-Tamsulosin HCI 0.5-0.4 MG Capsules KEY: [id number] Waiting response from CoverMyMeds.

## 2021-04-16 ENCOUNTER — Other Ambulatory Visit (HOSPITAL_BASED_OUTPATIENT_CLINIC_OR_DEPARTMENT_OTHER): Payer: Self-pay

## 2021-04-17 ENCOUNTER — Ambulatory Visit: Payer: 59 | Admitting: Family Medicine

## 2021-04-17 ENCOUNTER — Other Ambulatory Visit (HOSPITAL_BASED_OUTPATIENT_CLINIC_OR_DEPARTMENT_OTHER): Payer: Self-pay

## 2021-04-17 ENCOUNTER — Encounter: Payer: Self-pay | Admitting: Family Medicine

## 2021-04-17 ENCOUNTER — Other Ambulatory Visit: Payer: Self-pay

## 2021-04-17 VITALS — BP 108/76 | HR 87 | Temp 98.4°F | Ht 69.0 in | Wt 169.1 lb

## 2021-04-17 DIAGNOSIS — Z0000 Encounter for general adult medical examination without abnormal findings: Secondary | ICD-10-CM | POA: Diagnosis not present

## 2021-04-17 DIAGNOSIS — Z125 Encounter for screening for malignant neoplasm of prostate: Secondary | ICD-10-CM | POA: Diagnosis not present

## 2021-04-17 DIAGNOSIS — Z23 Encounter for immunization: Secondary | ICD-10-CM

## 2021-04-17 MED ORDER — DUTASTERIDE 0.5 MG PO CAPS
0.5000 mg | ORAL_CAPSULE | Freq: Every day | ORAL | 3 refills | Status: DC
  Filled 2021-04-17: qty 90, 90d supply, fill #0
  Filled 2021-08-05: qty 90, 90d supply, fill #1
  Filled 2021-11-17: qty 90, 90d supply, fill #2
  Filled 2022-03-04: qty 90, 90d supply, fill #3

## 2021-04-17 MED ORDER — TAMSULOSIN HCL 0.4 MG PO CAPS
0.4000 mg | ORAL_CAPSULE | Freq: Every day | ORAL | 3 refills | Status: DC
Start: 2021-04-17 — End: 2022-09-07
  Filled 2021-04-17: qty 90, 90d supply, fill #0
  Filled 2021-08-05: qty 90, 90d supply, fill #1
  Filled 2021-11-17: qty 90, 90d supply, fill #2
  Filled 2022-03-04: qty 90, 90d supply, fill #3

## 2021-04-17 NOTE — Progress Notes (Signed)
Chief Complaint  Patient presents with   Follow-up    Well Male Levi Martin is here for a complete physical.   His last physical was >1 year ago.  Current diet: in general, a "healthy" diet.  Current exercise: cycling, walking Weight trend: stable Fatigue out of ordinary? No. Seat belt? Yes.    Health maintenance Shingrix- Due for 2nd Colonoscopy- Yes Tetanus- Yes HIV- Yes Hep C- Yes   Past Medical History:  Diagnosis Date   BPH (benign prostatic hyperplasia)       Past Surgical History:  Procedure Laterality Date   PROSTATE BIOPSY     x2    Medications  Current Outpatient Medications on File Prior to Visit  Medication Sig Dispense Refill   influenza vac split quadrivalent PF (FLUARIX) 0.5 ML injection TAKE AS DIRECTED .5 mL 0    Allergies No Known Allergies  Family History Family History  Problem Relation Age of Onset   Cancer Neg Hx    Colon cancer Neg Hx    Esophageal cancer Neg Hx    Rectal cancer Neg Hx    Stomach cancer Neg Hx     Review of Systems: Constitutional:  no fevers Eye:  no recent significant change in vision Ear/Nose/Mouth/Throat:  Ears:  no hearing loss Nose/Mouth/Throat:  no complaints of nasal congestion, no sore throat Cardiovascular:  no chest pain Respiratory:  no shortness of breath Gastrointestinal:  no change in bowel habits GU:  Male: negative for dysuria, frequency Musculoskeletal/Extremities:  no joint pain Integumentary (Skin/Breast):  no abnormal skin lesions reported Neurologic:  no headaches Endocrine: No unexpected weight changes Hematologic/Lymphatic:  no abnormal bleeding  Exam BP 108/76   Pulse 87   Temp 98.4 F (36.9 C) (Oral)   Ht '5\' 9"'$  (1.753 m)   Wt 169 lb 2 oz (76.7 kg)   SpO2 97%   BMI 24.98 kg/m  General:  well developed, well nourished, in no apparent distress Skin:  no significant moles, warts, or growths Head:  no masses, lesions, or tenderness Eyes:  pupils equal and round, sclera  anicteric without injection Ears:  canals without lesions, TMs shiny without retraction, no obvious effusion, no erythema Nose:  nares patent, septum midline, mucosa normal Throat/Pharynx:  lips and gingiva without lesion; tongue and uvula midline; non-inflamed pharynx; no exudates or postnasal drainage Neck: neck supple without adenopathy, thyromegaly, or masses Cardiac: RRR, no bruits, no LE edema Lungs:  clear to auscultation, breath sounds equal bilaterally, no respiratory distress Abdomen: BS+, soft, non-tender, non-distended, no masses or organomegaly noted Rectal: Deferred Musculoskeletal:  symmetrical muscle groups noted without atrophy or deformity Neuro:  gait normal; deep tendon reflexes normal and symmetric Psych: well oriented with normal range of affect and appropriate judgment/insight  Assessment and Plan  Well adult exam - Plan: CBC, Comprehensive metabolic panel, Lipid panel, TSH  Screening for prostate cancer - Plan: PSA   Well 55 y.o. male. Counseled on diet and exercise. Counseled on risks and benefits of prostate cancer screening with PSA. The patient agrees to undergo testing. Flu shot rec'd for mid Oct.  Shingrix #2 today.  Will get covid booster that is updated for BA.4/5. Marland Kitchen  Immunizations, labs, and further orders as above. Follow up in 1 yr or prn. The patient voiced understanding and agreement to the plan.  [place], DO 04/17/21 7:54 AM

## 2021-04-17 NOTE — Telephone Encounter (Signed)
Received denial of this medication. PCP will address the alternative/send to pharmacy to split the prescription. Next OV with PCP 04/17/21 and will discuss.

## 2021-04-17 NOTE — Patient Instructions (Signed)
Give us 2-3 business days to get the results of your labs back.   Keep the diet clean and stay active.  I recommend getting the flu shot in mid October. This suggestion would change if the CDC comes out with a different recommendation.   Let us know if you need anything. 

## 2021-04-17 NOTE — Addendum Note (Signed)
Addended by: Sharon Seller B on: 04/17/2021 07:58 AM   Modules accepted: Orders

## 2021-06-22 ENCOUNTER — Other Ambulatory Visit: Payer: Self-pay

## 2021-06-22 ENCOUNTER — Other Ambulatory Visit: Payer: Self-pay | Admitting: Family Medicine

## 2021-06-22 ENCOUNTER — Other Ambulatory Visit (INDEPENDENT_AMBULATORY_CARE_PROVIDER_SITE_OTHER): Payer: 59

## 2021-06-22 DIAGNOSIS — Z0000 Encounter for general adult medical examination without abnormal findings: Secondary | ICD-10-CM | POA: Diagnosis not present

## 2021-06-22 DIAGNOSIS — Z125 Encounter for screening for malignant neoplasm of prostate: Secondary | ICD-10-CM

## 2021-06-22 DIAGNOSIS — R7989 Other specified abnormal findings of blood chemistry: Secondary | ICD-10-CM

## 2021-06-22 LAB — COMPREHENSIVE METABOLIC PANEL
ALT: 55 U/L — ABNORMAL HIGH (ref 0–53)
AST: 44 U/L — ABNORMAL HIGH (ref 0–37)
Albumin: 4.5 g/dL (ref 3.5–5.2)
Alkaline Phosphatase: 78 U/L (ref 39–117)
BUN: 14 mg/dL (ref 6–23)
CO2: 27 mEq/L (ref 19–32)
Calcium: 9.4 mg/dL (ref 8.4–10.5)
Chloride: 104 mEq/L (ref 96–112)
Creatinine, Ser: 1.11 mg/dL (ref 0.40–1.50)
GFR: 76.01 mL/min (ref >60.00)
Glucose, Bld: 100 mg/dL — ABNORMAL HIGH (ref 70–99)
Potassium: 4.5 mEq/L (ref 3.5–5.1)
Sodium: 137 mEq/L (ref 135–145)
Total Bilirubin: 1.4 mg/dL — ABNORMAL HIGH (ref 0.2–1.2)
Total Protein: 6.9 g/dL (ref 6.0–8.3)

## 2021-06-22 LAB — TSH: TSH: 3.47 u[IU]/mL (ref 0.35–5.50)

## 2021-06-22 LAB — LIPID PANEL
Cholesterol: 171 mg/dL (ref 0–200)
HDL: 52.9 mg/dL (ref >39.00)
LDL Cholesterol: 97 mg/dL (ref 0–99)
NonHDL: 117.69
Total CHOL/HDL Ratio: 3
Triglycerides: 103 mg/dL (ref 0.0–149.0)
VLDL: 20.6 mg/dL (ref 0.0–40.0)

## 2021-06-22 LAB — CBC
HCT: 46.4 % (ref 39.0–52.0)
Hemoglobin: 15.5 g/dL (ref 13.0–17.0)
MCHC: 33.4 g/dL (ref 30.0–36.0)
MCV: 90.1 fl (ref 78.0–100.0)
Platelets: 285 10*3/uL (ref 150.0–400.0)
RBC: 5.15 Mil/uL (ref 4.22–5.81)
RDW: 12.4 % (ref 11.5–15.5)
WBC: 6 10*3/uL (ref 4.0–10.5)

## 2021-06-22 LAB — PSA: PSA: 2.44 ng/mL (ref 0.10–4.00)

## 2021-08-05 ENCOUNTER — Other Ambulatory Visit (HOSPITAL_BASED_OUTPATIENT_CLINIC_OR_DEPARTMENT_OTHER): Payer: Self-pay

## 2021-08-06 ENCOUNTER — Other Ambulatory Visit (HOSPITAL_BASED_OUTPATIENT_CLINIC_OR_DEPARTMENT_OTHER): Payer: Self-pay

## 2021-10-20 DIAGNOSIS — N401 Enlarged prostate with lower urinary tract symptoms: Secondary | ICD-10-CM | POA: Diagnosis not present

## 2021-10-20 DIAGNOSIS — R82998 Other abnormal findings in urine: Secondary | ICD-10-CM | POA: Diagnosis not present

## 2021-11-17 ENCOUNTER — Other Ambulatory Visit (HOSPITAL_BASED_OUTPATIENT_CLINIC_OR_DEPARTMENT_OTHER): Payer: Self-pay

## 2021-11-18 ENCOUNTER — Other Ambulatory Visit (HOSPITAL_BASED_OUTPATIENT_CLINIC_OR_DEPARTMENT_OTHER): Payer: Self-pay

## 2022-03-04 ENCOUNTER — Other Ambulatory Visit (HOSPITAL_BASED_OUTPATIENT_CLINIC_OR_DEPARTMENT_OTHER): Payer: Self-pay

## 2022-03-05 ENCOUNTER — Other Ambulatory Visit (HOSPITAL_BASED_OUTPATIENT_CLINIC_OR_DEPARTMENT_OTHER): Payer: Self-pay

## 2022-04-07 ENCOUNTER — Encounter: Payer: Self-pay | Admitting: Family Medicine

## 2022-04-07 ENCOUNTER — Other Ambulatory Visit (HOSPITAL_BASED_OUTPATIENT_CLINIC_OR_DEPARTMENT_OTHER): Payer: Self-pay

## 2022-04-07 ENCOUNTER — Ambulatory Visit (INDEPENDENT_AMBULATORY_CARE_PROVIDER_SITE_OTHER): Payer: 59 | Admitting: Family Medicine

## 2022-04-07 VITALS — BP 108/62 | HR 69 | Temp 97.9°F | Ht 69.0 in | Wt 170.2 lb

## 2022-04-07 DIAGNOSIS — Z125 Encounter for screening for malignant neoplasm of prostate: Secondary | ICD-10-CM

## 2022-04-07 DIAGNOSIS — N400 Enlarged prostate without lower urinary tract symptoms: Secondary | ICD-10-CM | POA: Diagnosis not present

## 2022-04-07 DIAGNOSIS — Z0000 Encounter for general adult medical examination without abnormal findings: Secondary | ICD-10-CM

## 2022-04-07 MED ORDER — TRIAMCINOLONE ACETONIDE 0.1 % EX CREA
1.0000 | TOPICAL_CREAM | Freq: Two times a day (BID) | CUTANEOUS | 0 refills | Status: DC
  Filled 2022-04-07: qty 75, 38d supply, fill #0
  Filled 2023-02-09: qty 80, 40d supply, fill #1

## 2022-04-07 NOTE — Patient Instructions (Addendum)
Give [place] 2-3 business days to get the results of your labs back. If LFT's are WNL, we will send in Crestor 5 mg/d.   Keep the diet clean and stay active.  I recommend getting the flu shot in mid October. This suggestion would change if the CDC comes out with a different recommendation.   Please get me a copy of your advanced directive form at your convenience.   Let us know if you need anything.

## 2022-04-07 NOTE — Progress Notes (Signed)
Chief Complaint  Patient presents with   Annual Exam    Send in refill for Triamcinolone Acetonide cream 0.1% prn    Well Male Levi Martin is here for a complete physical.   His last physical was >1 year ago.  Current diet: in general, a "healthy" diet.  Current exercise: [employer], yoga, running Weight trend: stable Fatigue out of ordinary? No. Seat belt? Yes.   Advanced directive? Yes  Health maintenance Shingrix- Yes Colonoscopy- Yes Tetanus- Yes HIV- Yes Hep C- Yes   Past Medical History:  Diagnosis Date   BPH (benign prostatic hyperplasia)       Past Surgical History:  Procedure Laterality Date   PROSTATE BIOPSY     x2    Medications  Current Outpatient Medications on File Prior to Visit  Medication Sig Dispense Refill   dutasteride (AVODART) 0.5 MG capsule Take 1 capsule (0.5 mg total) by mouth daily. 90 capsule 3   tamsulosin (FLOMAX) 0.4 MG CAPS capsule Take 1 capsule (0.4 mg total) by mouth daily. 90 capsule 3   No current facility-administered medications on file prior to visit.     Allergies No Known Allergies  Family History Family History  Problem Relation Age of Onset   Cancer Neg Hx    Colon cancer Neg Hx    Esophageal cancer Neg Hx    Rectal cancer Neg Hx    Stomach cancer Neg Hx     Review of Systems: Constitutional:  no fevers Eye:  no recent significant change in vision Ear/Nose/Mouth/Throat:  Ears:  no hearing loss Nose/Mouth/Throat:  no complaints of nasal congestion, no sore throat Cardiovascular:  no chest pain Respiratory:  no shortness of breath Gastrointestinal:  no change in bowel habits GU:  Male: negative for dysuria, frequency Musculoskeletal/Extremities:  no joint pain Integumentary (Skin/Breast):  no abnormal skin lesions reported Neurologic:  no headaches Endocrine: No unexpected weight changes Hematologic/Lymphatic:  no abnormal bleeding  Exam BP 108/62   Pulse 69   Temp 97.9 F (36.6 C) (Oral)   Ht 5'  9" (1.753 m)   Wt 170 lb 4 oz (77.2 kg)   SpO2 95%   BMI 25.14 kg/m  General:  well developed, well nourished, in no apparent distress Skin:  no significant moles, warts, or growths Head:  no masses, lesions, or tenderness Eyes:  pupils equal and round, sclera anicteric without injection Ears:  canals without lesions, TMs shiny without retraction, no obvious effusion, no erythema Nose:  nares patent, septum midline, mucosa normal Throat/Pharynx:  lips and gingiva without lesion; tongue and uvula midline; non-inflamed pharynx; no exudates or postnasal drainage Neck: neck supple without adenopathy, thyromegaly, or masses Cardiac: RRR, no bruits, no LE edema Lungs:  clear to auscultation, breath sounds equal bilaterally, no respiratory distress Abdomen: BS+, soft, non-tender, non-distended, no masses or organomegaly noted Rectal: Deferred Musculoskeletal:  symmetrical muscle groups noted without atrophy or deformity Neuro:  gait normal; deep tendon reflexes normal and symmetric Psych: well oriented with normal range of affect and appropriate judgment/insight  Assessment and Plan  Well adult exam - Plan: CBC, Comprehensive metabolic panel, Lipid panel  Screening for prostate cancer - Plan: PSA  Enlarged prostate - Plan: MR PROSTATE W WO CONTRAST   Well 56 y.o. male. Counseled on diet and exercise. Counseled on risks and benefits of prostate cancer screening with PSA. The patient agrees to undergo testing. Will order prostate MRI on behalf of urology team.  If LFT's WNL, will order Crestor 5  mg/d and order lipid and hep function panel in 6 weeks at [facility]  Advanced directive form requested today.  Immunizations, labs, and further orders as above. Follow up in 1 yr. The patient voiced understanding and agreement to the plan.  [place], DO 04/07/22 2:40 PM

## 2022-04-08 ENCOUNTER — Other Ambulatory Visit (HOSPITAL_BASED_OUTPATIENT_CLINIC_OR_DEPARTMENT_OTHER): Payer: Self-pay

## 2022-04-08 ENCOUNTER — Other Ambulatory Visit: Payer: Self-pay | Admitting: Family Medicine

## 2022-04-08 LAB — CBC
HCT: 45.6 % (ref 39.0–52.0)
Hemoglobin: 15.1 g/dL (ref 13.0–17.0)
MCHC: 33.1 g/dL (ref 30.0–36.0)
MCV: 90.8 fl (ref 78.0–100.0)
Platelets: 284 10*3/uL (ref 150.0–400.0)
RBC: 5.02 Mil/uL (ref 4.22–5.81)
RDW: 12.3 % (ref 11.5–15.5)
WBC: 6.1 10*3/uL (ref 4.0–10.5)

## 2022-04-08 LAB — COMPREHENSIVE METABOLIC PANEL
ALT: 32 U/L (ref 0–53)
AST: 27 U/L (ref 0–37)
Albumin: 4.5 g/dL (ref 3.5–5.2)
Alkaline Phosphatase: 74 U/L (ref 39–117)
BUN: 10 mg/dL (ref 6–23)
CO2: 29 mEq/L (ref 19–32)
Calcium: 9.6 mg/dL (ref 8.4–10.5)
Chloride: 101 mEq/L (ref 96–112)
Creatinine, Ser: 1.06 mg/dL (ref 0.40–1.50)
GFR: 79.89 mL/min (ref >60.00)
Glucose, Bld: 86 mg/dL (ref 70–99)
Potassium: 5.1 mEq/L (ref 3.5–5.1)
Sodium: 139 mEq/L (ref 135–145)
Total Bilirubin: 1.3 mg/dL — ABNORMAL HIGH (ref 0.2–1.2)
Total Protein: 6.9 g/dL (ref 6.0–8.3)

## 2022-04-08 LAB — LIPID PANEL
Cholesterol: 132 mg/dL (ref 0–200)
HDL: 43.2 mg/dL (ref >39.00)
LDL Cholesterol: 74 mg/dL (ref 0–99)
NonHDL: 89.23
Total CHOL/HDL Ratio: 3
Triglycerides: 76 mg/dL (ref 0.0–149.0)
VLDL: 15.2 mg/dL (ref 0.0–40.0)

## 2022-04-08 LAB — PSA: PSA: 2 ng/mL (ref 0.10–4.00)

## 2022-04-08 MED ORDER — ROSUVASTATIN CALCIUM 5 MG PO TABS
5.0000 mg | ORAL_TABLET | Freq: Every day | ORAL | 3 refills | Status: DC
  Filled 2022-04-08 – 2022-09-07 (×2): qty 90, 90d supply, fill #0
  Filled 2022-09-07 – 2023-02-09 (×2): qty 90, 90d supply, fill #1

## 2022-04-09 ENCOUNTER — Other Ambulatory Visit: Payer: Self-pay | Admitting: Family Medicine

## 2022-04-09 DIAGNOSIS — E785 Hyperlipidemia, unspecified: Secondary | ICD-10-CM

## 2022-04-09 DIAGNOSIS — R7989 Other specified abnormal findings of blood chemistry: Secondary | ICD-10-CM

## 2022-05-29 ENCOUNTER — Other Ambulatory Visit: Payer: 59

## 2022-06-03 ENCOUNTER — Ambulatory Visit
Admission: RE | Admit: 2022-06-03 | Discharge: 2022-06-03 | Disposition: A | Discharge status: Home / Self Care | Payer: 59 | Source: Ambulatory Visit | Attending: Family Medicine | Admitting: Family Medicine

## 2022-06-03 DIAGNOSIS — R972 Elevated prostate specific antigen [PSA]: Secondary | ICD-10-CM | POA: Diagnosis not present

## 2022-06-03 DIAGNOSIS — N400 Enlarged prostate without lower urinary tract symptoms: Secondary | ICD-10-CM

## 2022-06-03 MED ORDER — GADOPICLENOL 0.5 MMOL/ML IV SOLN
7.0000 mL | Freq: Once | INTRAVENOUS | Status: AC | PRN
  Administered 2022-06-03: 7 mL via INTRAVENOUS

## 2022-09-07 ENCOUNTER — Other Ambulatory Visit: Payer: Self-pay | Admitting: Family Medicine

## 2022-09-07 ENCOUNTER — Other Ambulatory Visit (HOSPITAL_COMMUNITY): Payer: Self-pay

## 2022-09-07 ENCOUNTER — Other Ambulatory Visit (HOSPITAL_BASED_OUTPATIENT_CLINIC_OR_DEPARTMENT_OTHER): Payer: Self-pay

## 2022-09-07 MED ORDER — DUTASTERIDE 0.5 MG PO CAPS
0.5000 mg | ORAL_CAPSULE | Freq: Every day | ORAL | 2 refills | Status: AC
  Filled 2022-09-07 – 2022-09-10 (×2): qty 90, 90d supply, fill #0
  Filled 2022-12-13: qty 90, 90d supply, fill #1

## 2022-09-07 MED ORDER — TAMSULOSIN HCL 0.4 MG PO CAPS
0.4000 mg | ORAL_CAPSULE | Freq: Every day | ORAL | 2 refills | Status: AC
  Filled 2022-09-07 – 2022-09-10 (×2): qty 90, 90d supply, fill #0
  Filled 2022-12-13: qty 90, 90d supply, fill #1

## 2022-09-10 ENCOUNTER — Other Ambulatory Visit (HOSPITAL_BASED_OUTPATIENT_CLINIC_OR_DEPARTMENT_OTHER): Payer: Self-pay

## 2022-09-10 ENCOUNTER — Other Ambulatory Visit (HOSPITAL_COMMUNITY): Payer: Self-pay

## 2022-10-06 ENCOUNTER — Ambulatory Visit: Payer: Commercial Managed Care - PPO | Attending: Cardiology | Admitting: Cardiology

## 2022-10-06 ENCOUNTER — Encounter: Payer: Self-pay | Admitting: Cardiology

## 2022-10-06 ENCOUNTER — Other Ambulatory Visit (HOSPITAL_COMMUNITY): Payer: Self-pay

## 2022-10-06 VITALS — BP 114/88 | HR 84 | Ht 70.0 in | Wt 171.0 lb

## 2022-10-06 DIAGNOSIS — E785 Hyperlipidemia, unspecified: Secondary | ICD-10-CM

## 2022-10-06 DIAGNOSIS — R931 Abnormal findings on diagnostic imaging of heart and coronary circulation: Secondary | ICD-10-CM | POA: Diagnosis not present

## 2022-10-06 DIAGNOSIS — R0789 Other chest pain: Secondary | ICD-10-CM

## 2022-10-06 DIAGNOSIS — R079 Chest pain, unspecified: Secondary | ICD-10-CM

## 2022-10-06 MED ORDER — METOPROLOL TARTRATE 100 MG PO TABS
ORAL_TABLET | ORAL | 0 refills | Status: DC
  Filled 2022-10-06 – 2022-10-20 (×2): qty 1, 1d supply, fill #0

## 2022-10-06 NOTE — Patient Instructions (Signed)
Medication Instructions:   Take: Metoprolol 161m 1 tablet 2 hours prior to CT Scan  *If you need a refill on your cardiac medications before your next appointment, please call your pharmacy*   Lab Work: BMP- 1 week prior to CT scan 3[place]ends that you return for lab work in:    You need to have labs done when you are fasting.  You can come Monday through Friday 8:00 am to 11:30 am and 1:00 to 4:30. You do not need to make an appointment as the order has already been placed.     Testing/Procedures:   Your cardiac CT will be scheduled at one of the below locations:   [facility] [phone]  At [facility] prior to test start time. You can use the FREE valet parking offered at entrance C (encouraged to control the heart rate for the test)  [facility] (first floor) to check-in and test prep.  All radiology patients and guests should use entrance C2 at [facility] even though the hospital's physical address listed is [address]      Please follow these instructions carefully (unless otherwise directed):  Hold all erectile dysfunction medications at least 3 days (72 hrs) prior to test.  On the Night Before the Test: Be sure to Drink plenty of water. Do not consume any caffeinated/decaffeinated beverages or chocolate 12 hours prior to your test. Do not take any antihistamines 12 hours prior to your test.  On the Day of the Test: Drink plenty of water until 1 hour prior to the test. Do not eat any food 4 hours prior to the test. You may take your regular medications prior to the test.  Take metoprolol (Lopressor) two hours prior to test.       After the Test: Drink plenty  of water. After receiving IV contrast, you may experience a mild flushed feeling. This is normal. On occasion, you may experience a mild rash up to 24 hours after the test. This is not dangerous. If this occurs, you can take Benadryl 25 mg and increase your fluid intake. If you experience trouble breathing, this can be serious. If it is severe call 911 IMMEDIATELY. If it is mild, please call our office. If you take any of these medications: Glipizide/Metformin, Avandament, Glucavance, please do not take 48 hours after completing test unless otherwise instructed.  We will call to schedule your test 2-4 weeks out understanding that some insurance companies will need an authorization prior to the service being performed.   For non-scheduling related questions, please contact the cardiac imaging nurse navigator should you have any questions/concerns: SMarchia Bond Cardiac Imaging Nurse Navigator MGordy Clement Cardiac Imaging Nurse Navigator [facility] Direct Office Dial: [phone]  For scheduling needs, including cancellations and rescheduling, please call [place] [phone]    Follow-Up: At [facility] you and your health needs are our priority.  As part of our continuing mission to provide you with exceptional heart care, we have created designated Provider Care Teams.  These Care Teams include your primary Cardiologist (physician) and Advanced Practice Providers (APPs -  Physician Assistants and Nurse Practitioners) who all work together to provide you with the care you  need, when you need it.  We recommend signing up for the patient portal called "MyChart".  Sign up information is provided on this After Visit Summary.  MyChart is used to connect with patients for Virtual Visits (Telemedicine).  Patients are able to view lab/test results, encounter notes, upcoming appointments, etc.  Non-urgent messages can be sent to your provider as well.   To learn  more about what you can do with MyChart, go to [email or web address].    Your next appointment:    The format for your next appointment:     Provider:   Jenne Campus, MD   Other Instructions Cardiac CT Angiogram A cardiac CT angiogram is a procedure to look at the heart and the area around the heart. It may be done to help find the cause of chest pains or other symptoms of heart disease. During this procedure, a substance called contrast dye is injected into the blood vessels in the area to be checked. A large X-ray machine, called a CT scanner, then takes detailed pictures of the heart and the surrounding area. The procedure is also sometimes called a coronary CT angiogram, coronary artery scanning, or CTA. A cardiac CT angiogram allows the health care provider to see how well blood is flowing to and from the heart. The health care provider will be able to see if there are any problems, such as: Blockage or narrowing of the coronary arteries in the heart. Fluid around the heart. Signs of weakness or disease in the muscles, valves, and tissues of the heart. Tell a health care provider about: Any allergies you have. This is especially important if you have had a previous allergic reaction to contrast dye. All medicines you are taking, including vitamins, herbs, eye drops, creams, and over-the-counter medicines. Any blood disorders you have. Any surgeries you have had. Any medical conditions you have. Whether you are pregnant or may be pregnant. Any anxiety disorders, chronic pain, or other conditions you have that may increase your stress or prevent you from lying still. What are the risks? Generally, this is a safe procedure. However, problems may occur, including: Bleeding. Infection. Allergic reactions to medicines or dyes. Damage to other structures or organs. Kidney damage from the contrast dye that is used. Increased risk of cancer from radiation exposure. This risk is  low. Talk with your health care provider about: The risks and benefits of testing. How you can receive the lowest dose of radiation. What happens before the procedure? Wear comfortable clothing and remove any jewelry, glasses, dentures, and hearing aids. Follow instructions from your health care provider about eating and drinking. This may include: For 12 hours before the procedure -- avoid caffeine. This includes tea, coffee, soda, energy drinks, and diet pills. Drink plenty of water or other fluids that do not have caffeine in them. Being well hydrated can prevent complications. For 4-6 hours before the procedure -- stop eating and drinking. The contrast dye can cause nausea, but this is less likely if your stomach is empty. Ask your health care provider about changing or stopping your regular medicines. This is especially important if you are taking diabetes medicines, blood thinners, or medicines to treat problems with erections (erectile dysfunction). What happens during the procedure?  Hair on your chest may need to be removed so that small sticky patches called electrodes can be placed on your chest. These will transmit information that helps to monitor your heart during the procedure. An IV will be inserted into one of  your veins. You might be given a medicine to control your heart rate during the procedure. This will help to ensure that good images are obtained. You will be asked to lie on an exam table. This table will slide in and out of the CT machine during the procedure. Contrast dye will be injected into the IV. You might feel warm, or you may get a metallic taste in your mouth. You will be given a medicine called nitroglycerin. This will relax or dilate the arteries in your heart. The table that you are lying on will move into the CT machine tunnel for the scan. The person running the machine will give you instructions while the scans are being done. You may be asked to: Keep your  arms above your head. Hold your breath. Stay very still, even if the table is moving. When the scanning is complete, you will be moved out of the machine. The IV will be removed. The procedure may vary among health care providers and hospitals. What can I expect after the procedure? After your procedure, it is common to have: A metallic taste in your mouth from the contrast dye. A feeling of warmth. A headache from the nitroglycerin. Follow these instructions at home: Take over-the-counter and prescription medicines only as told by your health care provider. If you are told, drink enough fluid to keep your urine pale yellow. This will help to flush the contrast dye out of your body. Most people can return to their normal activities right after the procedure. Ask your health care provider what activities are safe for you. It is up to you to get the results of your procedure. Ask your health care provider, or the department that is doing the procedure, when your results will be ready. Keep all follow-up visits as told by your health care provider. This is important. Contact a health care provider if: You have any symptoms of allergy to the contrast dye. These include: Shortness of breath. Rash or hives. A racing heartbeat. Summary A cardiac CT angiogram is a procedure to look at the heart and the area around the heart. It may be done to help find the cause of chest pains or other symptoms of heart disease. During this procedure, a large X-ray machine, called a CT scanner, takes detailed pictures of the heart and the surrounding area after a contrast dye has been injected into blood vessels in the area. Ask your health care provider about changing or stopping your regular medicines before the procedure. This is especially important if you are taking diabetes medicines, blood thinners, or medicines to treat erectile dysfunction. If you are told, drink enough fluid to keep your urine pale  yellow. This will help to flush the contrast dye out of your body. This information is not intended to replace advice given to you by your health care provider. Make sure you discuss any questions you have with your health care provider. Document Revised: 03/28/2019 Document Reviewed: 03/28/2019 Elsevier Patient Education  [place].

## 2022-10-06 NOTE — Progress Notes (Signed)
Cardiology Consultation:    Date:  10/06/2022   ID:  Levi Martin, DOB 09/27/1965, MRN [id number]  PCP:  Shelda Pal, DO  Cardiologist:  Jenne Campus, MD   Referring MD: Shelda Pal*   No chief complaint on file. I have chest pain  History of Present Illness:    Levi Martin is a 57 y.o. male who is being seen today for the evaluation of chest pain at the request of Shelda Pal*.  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  Few years ago he did have calcium score done which was minimally elevated that calcium score has been repeated last year which is only 11.  He is not hypotensive and does not have diabetes his cholesterol is managed with low-dose of statin he never smoked does not have family history of premature coronary artery disease however he is concerned about some chest pain that he is being experiencing lately.  He does have difficulty describing that sensation is on the left side of his chest lasting for few minutes relatively mild maybe 1-2 in scale up to 10 no sweating no shortness of breath associated with this sensation no palpitations.  There are no provoking or relieving factor.  At the same time he exercised on the regular basis he uses stationary bike he also do some yoga and also some running and have no difficulty doing it.  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  Past Medical History:  Diagnosis Date   BPH (benign prostatic hyperplasia)     Past Surgical History:  Procedure Laterality Date   PROSTATE BIOPSY     x2    Current Medications: Current Meds  Medication Sig   dutasteride (AVODART) 0.5 MG capsule Take 1 capsule (0.5 mg total) by mouth daily.   metoprolol tartrate (LOPRESSOR) 100 MG tablet Take one tablet 2 hours before cardiac CT for heart greater than 55   rosuvastatin (CRESTOR) 5 MG tablet Take 1 tablet (5 mg total) by mouth daily.   tamsulosin (FLOMAX) 0.4 MG CAPS capsule Take 1  capsule (0.4 mg total) by mouth daily after supper.   triamcinolone cream (KENALOG) 0.1 % Apply 1 Application topically 2 (two) times daily.     Allergies:   Patient has no known allergies.   Social History   Socioeconomic History   Marital status: Married    Spouse name: Not on file   Number of children: Not on file   Years of education: Not on file   Highest education level: Not on file  Occupational History   Not on file  Tobacco Use   Smoking status: Never   Smokeless tobacco: Never  Substance and Sexual Activity   Alcohol use: Never   Drug use: Never   Sexual activity: Not on file  Other Topics Concern   Not on file  Social History Narrative   Not on file   Social Determinants of Health   Financial Resource Strain: Not on file  Food Insecurity: Not on file  Transportation Needs: Not on file  Physical Activity: Not on file  Stress: Not on file  Social Connections: Not on file     Family History: The patient's family history is negative for Cancer, Colon cancer, Esophageal cancer, Rectal cancer, and Stomach cancer. ROS:   Please see the history of present illness.    All 14 point review of systems negative except as described per history of present illness.  EKGs/Labs/Other Studies Reviewed:    The following studies were reviewed today: Calcium score reviewed 11.6 small deposit in the RCA as well as LAD  EKG:  EKG is  ordered today.  The ekg ordered today demonstrates normal sinus rhythm, normal P interval normal QS complex duration fulgent ST segment changes  Recent Labs: 04/07/2022: ALT 32; BUN 10; Creatinine, Ser 1.06; Hemoglobin 15.1; Platelets 284.0; Potassium 5.1; Sodium 139  Recent Lipid Panel    Component Value Date/Time   CHOL 132 04/07/2022 1441   TRIG 76.0 04/07/2022 1441   HDL 43.20 04/07/2022 1441   CHOLHDL 3 04/07/2022 1441   VLDL 15.2 04/07/2022 1441   LDLCALC 74 04/07/2022 1441    Physical Exam:    VS:  BP 114/88 (BP Location: Left  Arm, Patient Position: Sitting)   Pulse 84   Ht '5\' 10"'$  (1.778 m)   Wt 171 lb (77.6 kg)   SpO2 98%   BMI 24.54 kg/m     Wt Readings from Last 3 Encounters:  10/06/22 171 lb (77.6 kg)  04/07/22 170 lb 4 oz (77.2 kg)  04/17/21 169 lb 2 oz (76.7 kg)     GEN:  Well nourished, well developed in no acute distress HEENT: Normal NECK: No JVD; No carotid bruits LYMPHATICS: No lymphadenopathy CARDIAC: RRR, no murmurs, no rubs, no gallops RESPIRATORY:  Clear to auscultation without rales, wheezing or rhonchi  ABDOMEN: Soft, non-tender, non-distended MUSCULOSKELETAL:  No edema; No deformity  SKIN: Warm and dry NEUROLOGIC:  Alert and oriented x 3 PSYCHIATRIC:  Normal affect   ASSESSMENT:    1. Chest pain of uncertain etiology   2. Atypical chest pain   3. Agatston coronary artery calcium score less than 100   4. Dyslipidemia    PLAN:    In order of problems listed above:  Atypical symptomatology in patient with relatively low risk for coronary artery disease.  We did calculate his predicted 10 years risk which is only 3.3 right now with cholesterol medication.  However his symptoms are concerning he is very concerned about it.  Will schedule him to have coronary CT angio to rule out obstructive disease and have better understanding about characteristic of his disease.  He did have very minimal calcium deposits. Dyslipidemia.  He is taking Crestor 5 I do have fasting lipid profile from summer of last year LDL was 74 HDL 43.  Will continue present management.   Medication Adjustments/Labs and Tests Ordered: Current medicines are reviewed at length with the patient today.  Concerns regarding medicines are outlined above.  Orders Placed This Encounter  Procedures   CT CORONARY MORPH W/CTA COR W/SCORE W/CA W/CM &/OR WO/CM   Basic metabolic panel   EKG [id number]   Meds ordered this encounter  Medications   metoprolol tartrate (LOPRESSOR) 100 MG tablet    Sig: Take one tablet 2 hours  before cardiac CT for heart greater than 55    Dispense:  1 tablet    Refill:  0    Signed, Park Liter, MD, [facility]. 10/06/2022 4:11 PM    [place]

## 2022-10-15 ENCOUNTER — Other Ambulatory Visit (HOSPITAL_COMMUNITY): Payer: Self-pay

## 2022-10-20 ENCOUNTER — Other Ambulatory Visit (HOSPITAL_COMMUNITY): Payer: Self-pay

## 2022-10-26 ENCOUNTER — Other Ambulatory Visit: Payer: Commercial Managed Care - PPO

## 2022-10-27 ENCOUNTER — Telehealth (HOSPITAL_COMMUNITY): Payer: Self-pay | Admitting: *Deleted

## 2022-10-27 DIAGNOSIS — R079 Chest pain, unspecified: Secondary | ICD-10-CM | POA: Diagnosis not present

## 2022-10-27 NOTE — Telephone Encounter (Signed)
Attempted to call patient regarding upcoming cardiac CT appointment. °Left message on voicemail with name and callback number ° °Noam Karaffa RN Navigator Cardiac Imaging °[place] [facility] [place] [facility] °[phone] Office °[phone] Cell ° °

## 2022-10-27 NOTE — Telephone Encounter (Signed)
Reaching out to patient to offer assistance regarding upcoming cardiac imaging study; pt verbalizes understanding of appt date/time, parking situation and where to check in, pre-test NPO status and medications ordered, and verified current allergies; name and call back number provided for further questions should they arise  Merritt Kibby RN Navigator Cardiac Imaging [place] [facility] [phone] office [phone] cell  Patient to take 100mg metoprolol tartrate two hours prior to his cardiac CT scan. He is aware to arrive at 8am. 

## 2022-10-28 ENCOUNTER — Ambulatory Visit (HOSPITAL_COMMUNITY)
Admission: RE | Admit: 2022-10-28 | Discharge: 2022-10-28 | Disposition: A | Discharge status: Home / Self Care | Payer: Commercial Managed Care - PPO | Source: Ambulatory Visit | Attending: Cardiology | Admitting: Cardiology

## 2022-10-28 DIAGNOSIS — R079 Chest pain, unspecified: Secondary | ICD-10-CM

## 2022-10-28 LAB — BASIC METABOLIC PANEL
BUN/Creatinine Ratio: 11 (ref 9–20)
BUN: 13 mg/dL (ref 6–24)
CO2: 19 mmol/L — ABNORMAL LOW (ref 20–29)
Calcium: 9.6 mg/dL (ref 8.7–10.2)
Chloride: 105 mmol/L (ref 96–106)
Creatinine, Ser: 1.16 mg/dL (ref 0.76–1.27)
Glucose: 96 mg/dL (ref 70–99)
Potassium: 4.2 mmol/L (ref 3.5–5.2)
Sodium: 142 mmol/L (ref 134–144)
eGFR: 75 mL/min/{1.73_m2} (ref >59)

## 2022-10-28 MED ORDER — IOHEXOL 350 MG/ML SOLN
100.0000 mL | Freq: Once | INTRAVENOUS | Status: AC | PRN
  Administered 2022-10-28: 100 mL via INTRAVENOUS

## 2022-10-28 MED ORDER — NITROGLYCERIN 0.4 MG SL SUBL
0.8000 mg | SUBLINGUAL_TABLET | Freq: Once | SUBLINGUAL | Status: AC
  Administered 2022-10-28: 0.8 mg via SUBLINGUAL

## 2022-10-28 MED ORDER — NITROGLYCERIN 0.4 MG SL SUBL
SUBLINGUAL_TABLET | SUBLINGUAL | Status: AC
  Filled 2022-10-28: qty 2

## 2022-11-30 ENCOUNTER — Encounter: Payer: Self-pay | Admitting: Family Medicine

## 2022-12-01 ENCOUNTER — Other Ambulatory Visit (HOSPITAL_BASED_OUTPATIENT_CLINIC_OR_DEPARTMENT_OTHER): Payer: Self-pay

## 2022-12-01 ENCOUNTER — Other Ambulatory Visit: Payer: Self-pay | Admitting: Family Medicine

## 2022-12-01 ENCOUNTER — Other Ambulatory Visit (HOSPITAL_COMMUNITY): Payer: Self-pay

## 2022-12-01 MED ORDER — SULFAMETHOXAZOLE-TRIMETHOPRIM 800-160 MG PO TABS
1.0000 | ORAL_TABLET | Freq: Two times a day (BID) | ORAL | 0 refills | Status: AC
  Filled 2022-12-01 (×2): qty 28, 14d supply, fill #0

## 2022-12-02 ENCOUNTER — Other Ambulatory Visit (INDEPENDENT_AMBULATORY_CARE_PROVIDER_SITE_OTHER): Payer: Commercial Managed Care - PPO

## 2022-12-02 ENCOUNTER — Other Ambulatory Visit: Payer: Self-pay | Admitting: Family Medicine

## 2022-12-02 DIAGNOSIS — N410 Acute prostatitis: Secondary | ICD-10-CM

## 2022-12-02 LAB — COMPREHENSIVE METABOLIC PANEL
ALT: 36 U/L (ref 0–53)
AST: 35 U/L (ref 0–37)
Albumin: 4.5 g/dL (ref 3.5–5.2)
Alkaline Phosphatase: 65 U/L (ref 39–117)
BUN: 12 mg/dL (ref 6–23)
CO2: 26 mEq/L (ref 19–32)
Calcium: 9.5 mg/dL (ref 8.4–10.5)
Chloride: 105 mEq/L (ref 96–112)
Creatinine, Ser: 1.25 mg/dL (ref 0.40–1.50)
GFR: 65.25 mL/min (ref >60.00)
Glucose, Bld: 92 mg/dL (ref 70–99)
Potassium: 4.2 mEq/L (ref 3.5–5.1)
Sodium: 138 mEq/L (ref 135–145)
Total Bilirubin: 1 mg/dL (ref 0.2–1.2)
Total Protein: 7.1 g/dL (ref 6.0–8.3)

## 2022-12-02 LAB — CBC
HCT: 44.4 % (ref 39.0–52.0)
Hemoglobin: 15 g/dL (ref 13.0–17.0)
MCHC: 33.7 g/dL (ref 30.0–36.0)
MCV: 89.5 fl (ref 78.0–100.0)
Platelets: 288 10*3/uL (ref 150.0–400.0)
RBC: 4.96 Mil/uL (ref 4.22–5.81)
RDW: 12.3 % (ref 11.5–15.5)
WBC: 6.9 10*3/uL (ref 4.0–10.5)

## 2022-12-06 LAB — PSA, TOTAL AND FREE
PSA, % Free: 15 % (calc) — ABNORMAL LOW (ref >25)
PSA, Free: 0.4 ng/mL
PSA, Total: 2.6 ng/mL (ref <=4.0)

## 2022-12-07 DIAGNOSIS — N401 Enlarged prostate with lower urinary tract symptoms: Secondary | ICD-10-CM | POA: Diagnosis not present

## 2022-12-07 DIAGNOSIS — R350 Frequency of micturition: Secondary | ICD-10-CM | POA: Diagnosis not present

## 2022-12-07 DIAGNOSIS — R82998 Other abnormal findings in urine: Secondary | ICD-10-CM | POA: Diagnosis not present

## 2022-12-13 ENCOUNTER — Other Ambulatory Visit (HOSPITAL_COMMUNITY): Payer: Self-pay

## 2023-02-09 ENCOUNTER — Other Ambulatory Visit (HOSPITAL_COMMUNITY): Payer: Self-pay

## 2023-02-09 ENCOUNTER — Other Ambulatory Visit: Payer: Self-pay

## 2023-02-11 ENCOUNTER — Other Ambulatory Visit: Payer: Self-pay | Admitting: Family Medicine

## 2023-02-11 ENCOUNTER — Other Ambulatory Visit (HOSPITAL_COMMUNITY): Payer: Self-pay

## 2023-02-14 ENCOUNTER — Other Ambulatory Visit (HOSPITAL_COMMUNITY): Payer: Self-pay

## 2023-02-14 MED ORDER — TRIAMCINOLONE ACETONIDE 0.1 % EX CREA
1.0000 | TOPICAL_CREAM | Freq: Two times a day (BID) | CUTANEOUS | 0 refills | Status: AC
  Filled 2023-02-14: qty 80, 30d supply, fill #0

## 2023-07-06 ENCOUNTER — Other Ambulatory Visit (HOSPITAL_COMMUNITY): Payer: Self-pay

## 2023-07-06 ENCOUNTER — Other Ambulatory Visit: Payer: Self-pay | Admitting: Family Medicine

## 2023-07-07 ENCOUNTER — Other Ambulatory Visit (HOSPITAL_COMMUNITY): Payer: Self-pay

## 2023-07-07 MED ORDER — ROSUVASTATIN CALCIUM 5 MG PO TABS
5.0000 mg | ORAL_TABLET | Freq: Every day | ORAL | 0 refills | Status: DC
  Filled 2023-07-07: qty 90, 90d supply, fill #0

## 2023-07-08 ENCOUNTER — Other Ambulatory Visit (HOSPITAL_COMMUNITY): Payer: Self-pay

## 2023-08-22 ENCOUNTER — Encounter: Payer: Self-pay | Admitting: Family Medicine

## 2023-08-22 ENCOUNTER — Ambulatory Visit (INDEPENDENT_AMBULATORY_CARE_PROVIDER_SITE_OTHER): Payer: Commercial Managed Care - PPO | Admitting: Family Medicine

## 2023-08-22 VITALS — BP 118/72 | HR 84 | Temp 98.0°F | Resp 16 | Ht 71.0 in | Wt 170.0 lb

## 2023-08-22 DIAGNOSIS — N401 Enlarged prostate with lower urinary tract symptoms: Secondary | ICD-10-CM

## 2023-08-22 DIAGNOSIS — R350 Frequency of micturition: Secondary | ICD-10-CM

## 2023-08-22 DIAGNOSIS — Z0000 Encounter for general adult medical examination without abnormal findings: Secondary | ICD-10-CM

## 2023-08-22 DIAGNOSIS — Z125 Encounter for screening for malignant neoplasm of prostate: Secondary | ICD-10-CM

## 2023-08-22 NOTE — Patient Instructions (Addendum)
 Give [place] 2-3 business days to get the results of your labs back.  ? ?Keep the diet clean and stay active. ? ?Please get me a copy of your advanced directive form at your convenience.  ? ?If you do not hear anything about your referral in the next 1-2 weeks, call our office and ask for an update. ? ?Let us know if you need anything. ?

## 2023-08-22 NOTE — Progress Notes (Signed)
 Chief Complaint  Patient presents with   Annual Exam    Annual Exam    Well Male Levi Martin is here for a complete physical.   His last physical was >1 year ago.  Current diet: in general, a healthy diet.  Current exercise: walking, strength training Weight trend: stable Fatigue out of ordinary? No. Seat belt? Yes.   Advanced directive? Yes  Health maintenance Shingrix - Yes Colonoscopy- Yes Tetanus- Yes HIV- Yes Hep C- Yes Lung cancer screening- Yes   Past Medical History:  Diagnosis Date   BPH (benign prostatic hyperplasia)       Past Surgical History:  Procedure Laterality Date   PROSTATE BIOPSY     x2    Medications  Current Outpatient Medications on File Prior to Visit  Medication Sig Dispense Refill   dutasteride  (AVODART ) 0.5 MG capsule Take 1 capsule (0.5 mg total) by mouth daily. 90 capsule 2   rosuvastatin  (CRESTOR ) 5 MG tablet Take 1 tablet (5 mg total) by mouth daily. Please make appointment for further refills. 90 tablet 0   tamsulosin  (FLOMAX ) 0.4 MG CAPS capsule Take 1 capsule (0.4 mg total) by mouth daily after supper. 90 capsule 2   triamcinolone  cream (KENALOG ) 0.1 % Apply 1 Application topically 2 (two) times daily. 80 g 0    Allergies No Known Allergies  Family History Family History  Problem Relation Age of Onset   Cancer Neg Hx    Colon cancer Neg Hx    Esophageal cancer Neg Hx    Rectal cancer Neg Hx    Stomach cancer Neg Hx     Review of Systems: Constitutional:  no fevers Eye:  no recent significant change in vision Ear/Nose/Mouth/Throat:  Ears:  no hearing loss Nose/Mouth/Throat:  no complaints of nasal congestion, no sore throat Cardiovascular:  no chest pain Respiratory:  no shortness of breath Gastrointestinal:  no change in bowel habits GU:  Male: negative for dysuria, new frequency Musculoskeletal/Extremities:  no joint pain Integumentary (Skin/Breast):  no abnormal skin lesions reported Neurologic:  no  headaches Endocrine: No unexpected weight changes Hematologic/Lymphatic:  no abnormal bleeding  Exam BP 118/72   Pulse 84   Temp 98 F (36.7 C) (Oral)   Resp 16   Ht 5' 11 (1.803 m)   Wt 170 lb (77.1 kg)   SpO2 98%   BMI 23.71 kg/m  General:  well developed, well nourished, in no apparent distress Skin:  no significant moles, warts, or growths Head:  no masses, lesions, or tenderness Eyes:  pupils equal and round, sclera anicteric without injection Ears:  canals without lesions, TMs shiny without retraction, no obvious effusion, no erythema Nose:  nares patent, mucosa normal Throat/Pharynx:  lips and gingiva without lesion; tongue and uvula midline; non-inflamed pharynx; no exudates or postnasal drainage Neck: neck supple without adenopathy, thyromegaly, or masses Cardiac: RRR, no bruits, no LE edema Lungs:  clear to auscultation, breath sounds equal bilaterally, no respiratory distress Abdomen: BS+, soft, non-tender, non-distended, no masses or organomegaly noted Rectal: Deferred Musculoskeletal:  symmetrical muscle groups noted without atrophy or deformity Neuro:  gait normal; deep tendon reflexes normal and symmetric Psych: well oriented with normal range of affect and appropriate judgment/insight  Assessment and Plan  Well adult exam - Plan: CBC, Comprehensive metabolic panel, Lipid panel  Screening for prostate cancer - Plan: PSA, total and free  Benign prostatic hyperplasia with urinary frequency - Plan: Ambulatory referral to Urology   Well 58 y.o. male. Counseled on diet  and exercise. Counseled on risks and benefits of prostate cancer screening with PSA. The patient agrees to undergo testing. Immunizations, labs, and further orders as above. Follow up in 1 yr. The patient voiced understanding and agreement to the plan.  Mabel [place], DO 08/22/23 1:30 PM

## 2023-09-15 ENCOUNTER — Other Ambulatory Visit (INDEPENDENT_AMBULATORY_CARE_PROVIDER_SITE_OTHER): Payer: 59

## 2023-09-15 ENCOUNTER — Other Ambulatory Visit: Payer: Self-pay

## 2023-09-15 ENCOUNTER — Other Ambulatory Visit: Payer: Self-pay | Admitting: Family Medicine

## 2023-09-15 DIAGNOSIS — Z125 Encounter for screening for malignant neoplasm of prostate: Secondary | ICD-10-CM | POA: Diagnosis not present

## 2023-09-15 DIAGNOSIS — Z0000 Encounter for general adult medical examination without abnormal findings: Secondary | ICD-10-CM

## 2023-09-15 LAB — LIPID PANEL
Cholesterol: 128 mg/dL (ref 0–200)
HDL: 50 mg/dL (ref >39.00)
LDL Cholesterol: 63 mg/dL (ref 0–99)
NonHDL: 77.7
Total CHOL/HDL Ratio: 3
Triglycerides: 72 mg/dL (ref 0.0–149.0)
VLDL: 14.4 mg/dL (ref 0.0–40.0)

## 2023-09-15 LAB — CBC
HCT: 45.9 % (ref 39.0–52.0)
Hemoglobin: 15.2 g/dL (ref 13.0–17.0)
MCHC: 33.1 g/dL (ref 30.0–36.0)
MCV: 90.3 fL (ref 78.0–100.0)
Platelets: 251 10*3/uL (ref 150.0–400.0)
RBC: 5.08 Mil/uL (ref 4.22–5.81)
RDW: 12.4 % (ref 11.5–15.5)
WBC: 5.5 10*3/uL (ref 4.0–10.5)

## 2023-09-15 LAB — COMPREHENSIVE METABOLIC PANEL
ALT: 45 U/L (ref 0–53)
AST: 37 U/L (ref 0–37)
Albumin: 4.6 g/dL (ref 3.5–5.2)
Alkaline Phosphatase: 67 U/L (ref 39–117)
BUN: 12 mg/dL (ref 6–23)
CO2: 29 meq/L (ref 19–32)
Calcium: 9.8 mg/dL (ref 8.4–10.5)
Chloride: 104 meq/L (ref 96–112)
Creatinine, Ser: 1.03 mg/dL (ref 0.40–1.50)
GFR: 81.86 mL/min (ref >60.00)
Glucose, Bld: 85 mg/dL (ref 70–99)
Potassium: 4 meq/L (ref 3.5–5.1)
Sodium: 140 meq/L (ref 135–145)
Total Bilirubin: 1 mg/dL (ref 0.2–1.2)
Total Protein: 7.1 g/dL (ref 6.0–8.3)

## 2023-09-16 ENCOUNTER — Encounter: Payer: Self-pay | Admitting: Family Medicine

## 2023-09-19 LAB — PSA, TOTAL AND FREE
PSA, % Free: 19 % — ABNORMAL LOW (ref >25)
PSA, Free: 0.5 ng/mL
PSA, Total: 2.6 ng/mL (ref <=4.0)

## 2023-09-20 ENCOUNTER — Encounter: Payer: Self-pay | Admitting: Family Medicine

## 2023-10-26 ENCOUNTER — Other Ambulatory Visit (HOSPITAL_COMMUNITY): Payer: Self-pay

## 2023-10-26 ENCOUNTER — Other Ambulatory Visit: Payer: Self-pay | Admitting: Family Medicine

## 2023-10-26 MED ORDER — ROSUVASTATIN CALCIUM 5 MG PO TABS
5.0000 mg | ORAL_TABLET | Freq: Every day | ORAL | 0 refills | Status: DC
  Filled 2023-10-26: qty 90, 90d supply, fill #0

## 2024-02-24 DIAGNOSIS — R350 Frequency of micturition: Secondary | ICD-10-CM | POA: Diagnosis not present

## 2024-02-24 DIAGNOSIS — N401 Enlarged prostate with lower urinary tract symptoms: Secondary | ICD-10-CM | POA: Diagnosis not present

## 2024-03-08 ENCOUNTER — Other Ambulatory Visit: Payer: Self-pay | Admitting: Family Medicine

## 2024-03-08 ENCOUNTER — Other Ambulatory Visit (HOSPITAL_COMMUNITY): Payer: Self-pay

## 2024-03-08 MED ORDER — ROSUVASTATIN CALCIUM 5 MG PO TABS
5.0000 mg | ORAL_TABLET | Freq: Every day | ORAL | 0 refills | Status: DC
  Filled 2024-03-08: qty 30, 30d supply, fill #0

## 2024-03-19 DIAGNOSIS — R350 Frequency of micturition: Secondary | ICD-10-CM | POA: Diagnosis not present

## 2024-03-19 DIAGNOSIS — N138 Other obstructive and reflux uropathy: Secondary | ICD-10-CM | POA: Diagnosis not present

## 2024-03-19 DIAGNOSIS — N401 Enlarged prostate with lower urinary tract symptoms: Secondary | ICD-10-CM | POA: Diagnosis not present

## 2024-03-19 DIAGNOSIS — N400 Enlarged prostate without lower urinary tract symptoms: Secondary | ICD-10-CM | POA: Diagnosis not present

## 2024-05-10 ENCOUNTER — Other Ambulatory Visit: Payer: Self-pay | Admitting: Family Medicine

## 2024-05-10 ENCOUNTER — Other Ambulatory Visit (HOSPITAL_COMMUNITY): Payer: Self-pay

## 2024-05-10 ENCOUNTER — Other Ambulatory Visit: Payer: Self-pay

## 2024-05-10 MED ORDER — ROSUVASTATIN CALCIUM 5 MG PO TABS
5.0000 mg | ORAL_TABLET | Freq: Every day | ORAL | 0 refills | Status: DC
  Filled 2024-05-10: qty 30, 30d supply, fill #0

## 2024-05-14 ENCOUNTER — Ambulatory Visit (INDEPENDENT_AMBULATORY_CARE_PROVIDER_SITE_OTHER): Admitting: Family Medicine

## 2024-05-14 ENCOUNTER — Encounter: Payer: Self-pay | Admitting: Family Medicine

## 2024-05-14 VITALS — BP 126/74 | HR 80 | Temp 98.0°F | Resp 16 | Ht 71.0 in | Wt 167.0 lb

## 2024-05-14 DIAGNOSIS — M7989 Other specified soft tissue disorders: Secondary | ICD-10-CM

## 2024-05-14 NOTE — Progress Notes (Signed)
 Chief Complaint  Patient presents with   Pain    Thigh pain    Levi Martin is a 58 y.o. male here for a skin complaint.  Duration: 1 year Location: Anterior thighs Pruritic? No Painful? Yes Drainage? No New soaps/lotions/topicals/detergents? No Trauma? No Other associated symptoms: They are enlarging No overlying redness. Therapies tried thus far: none  Past Medical History:  Diagnosis Date   BPH (benign prostatic hyperplasia)     BP 126/74 (BP Location: Left Arm, Patient Position: Sitting)   Pulse 80   Temp 98 F (36.7 C) (Oral)   Resp 16   Ht 5' 11 (1.803 m)   Wt 167 lb (75.8 kg)   SpO2 98%   BMI 23.29 kg/m  Gen: awake, alert, appearing stated age Lungs: No accessory muscle use Skin: Rubbery and superficial nodules noted over the anterior thighs.  The largest is noted to be 5 cm x 3 cm in dimension.  Several others are around 1 cm in diameter.  They are TTP.  No drainage, erythema, fluctuance, excoriation Psych: Age appropriate judgment and insight  Mass of soft tissue of thigh - Plan: MR FEMUR LEFT W WO CONTRAST, MR FEMUR RIGHT W WO CONTRAST  Given the enlargement of these lesions, we will check a MRI w and wo contrast to rule out sinister etiology.  Ice, Tylenol as needed. F/u prn. The patient voiced understanding and agreement to the plan.  Mabel [place], DO 05/14/24 4:47 PM

## 2024-05-14 NOTE — Patient Instructions (Signed)
 Someone should call in the next several days to schedule this.   Ice/cold pack over area for 10-15 min twice daily.  OK to take Tylenol 1000 mg (2 extra strength tabs) or 975 mg (3 regular strength tabs) every 6 hours as needed.  Let us  know if you need anything.

## 2024-06-06 ENCOUNTER — Other Ambulatory Visit (HOSPITAL_BASED_OUTPATIENT_CLINIC_OR_DEPARTMENT_OTHER): Payer: Self-pay

## 2024-06-06 MED ORDER — FLUZONE 0.5 ML IM SUSY
0.5000 mL | PREFILLED_SYRINGE | Freq: Once | INTRAMUSCULAR | 0 refills | Status: AC
  Filled 2024-06-06: qty 0.5, 1d supply, fill #0

## 2024-06-20 ENCOUNTER — Ambulatory Visit
Admission: RE | Admit: 2024-06-20 | Discharge: 2024-06-20 | Disposition: A | Discharge status: Home / Self Care | Source: Ambulatory Visit | Attending: Family Medicine | Admitting: Family Medicine

## 2024-06-20 ENCOUNTER — Ambulatory Visit: Payer: Self-pay | Admitting: Family Medicine

## 2024-06-20 DIAGNOSIS — M7989 Other specified soft tissue disorders: Secondary | ICD-10-CM

## 2024-06-20 DIAGNOSIS — R2241 Localized swelling, mass and lump, right lower limb: Secondary | ICD-10-CM | POA: Diagnosis not present

## 2024-06-20 MED ORDER — GADOPICLENOL 0.5 MMOL/ML IV SOLN
7.5000 mL | Freq: Once | INTRAVENOUS | Status: AC | PRN
Start: 2024-06-20 — End: 2024-06-20
  Administered 2024-06-20: 7.5 mL via INTRAVENOUS

## 2024-06-30 ENCOUNTER — Other Ambulatory Visit: Payer: Self-pay | Admitting: Family Medicine

## 2024-07-02 ENCOUNTER — Other Ambulatory Visit (HOSPITAL_COMMUNITY): Payer: Self-pay

## 2024-07-02 ENCOUNTER — Other Ambulatory Visit: Payer: Self-pay

## 2024-07-02 MED ORDER — ROSUVASTATIN CALCIUM 5 MG PO TABS
5.0000 mg | ORAL_TABLET | Freq: Every day | ORAL | 0 refills | Status: DC
  Filled 2024-07-02: qty 30, 30d supply, fill #0

## 2024-09-12 ENCOUNTER — Other Ambulatory Visit: Payer: Self-pay | Admitting: Family Medicine

## 2024-09-12 ENCOUNTER — Other Ambulatory Visit: Payer: Self-pay

## 2024-09-12 ENCOUNTER — Other Ambulatory Visit (HOSPITAL_BASED_OUTPATIENT_CLINIC_OR_DEPARTMENT_OTHER): Payer: Self-pay

## 2024-09-12 ENCOUNTER — Other Ambulatory Visit (HOSPITAL_COMMUNITY): Payer: Self-pay

## 2024-09-12 ENCOUNTER — Encounter: Payer: Self-pay | Admitting: Family Medicine

## 2024-09-12 MED ORDER — ROSUVASTATIN CALCIUM 5 MG PO TABS
5.0000 mg | ORAL_TABLET | Freq: Every day | ORAL | 0 refills | Status: DC
  Filled 2024-09-12: qty 30, 30d supply, fill #0

## 2024-09-12 MED ORDER — ROSUVASTATIN CALCIUM 5 MG PO TABS
5.0000 mg | ORAL_TABLET | Freq: Every day | ORAL | 1 refills | Status: AC
  Filled 2024-09-12 – 2024-09-14 (×2): qty 90, 90d supply, fill #0

## 2024-09-14 ENCOUNTER — Other Ambulatory Visit (HOSPITAL_BASED_OUTPATIENT_CLINIC_OR_DEPARTMENT_OTHER): Payer: Self-pay

## 2024-09-14 ENCOUNTER — Other Ambulatory Visit (HOSPITAL_COMMUNITY): Payer: Self-pay

## 2024-09-17 ENCOUNTER — Other Ambulatory Visit (HOSPITAL_COMMUNITY): Payer: Self-pay

## 2024-09-19 ENCOUNTER — Other Ambulatory Visit (HOSPITAL_COMMUNITY): Payer: Self-pay

## 2024-09-20 ENCOUNTER — Other Ambulatory Visit (HOSPITAL_COMMUNITY): Payer: Self-pay

## 2024-10-08 ENCOUNTER — Ambulatory Visit: Admitting: Orthopedic Surgery

## 2024-10-29 ENCOUNTER — Ambulatory Visit: Admitting: Orthopedic Surgery
